# Patient Record
Sex: Female | Born: 1975
Health system: Southern US, Community
[De-identification: ages and names within clinical notes are randomized; demographics above are authoritative.]

## PROBLEM LIST (undated history)

## (undated) DIAGNOSIS — D649 Anemia, unspecified: Secondary | ICD-10-CM

## (undated) DIAGNOSIS — T7840XA Allergy, unspecified, initial encounter: Secondary | ICD-10-CM

## (undated) DIAGNOSIS — F32A Depression, unspecified: Secondary | ICD-10-CM

## (undated) DIAGNOSIS — F419 Anxiety disorder, unspecified: Secondary | ICD-10-CM

## (undated) HISTORY — DX: Allergy, unspecified, initial encounter: T78.40XA

## (undated) HISTORY — DX: Depression, unspecified: F32.A

## (undated) HISTORY — PX: APPENDECTOMY: SHX54

## (undated) HISTORY — DX: Anxiety disorder, unspecified: F41.9

## (undated) HISTORY — DX: Anemia, unspecified: D64.9

---

## 1999-11-16 ENCOUNTER — Other Ambulatory Visit: Admission: RE | Admit: 1999-11-16 | Discharge: 1999-11-16 | Payer: Self-pay | Admitting: Obstetrics and Gynecology

## 2000-05-17 ENCOUNTER — Other Ambulatory Visit: Admission: RE | Admit: 2000-05-17 | Discharge: 2000-05-17 | Payer: Self-pay | Admitting: Obstetrics and Gynecology

## 2000-08-29 ENCOUNTER — Other Ambulatory Visit: Admission: RE | Admit: 2000-08-29 | Discharge: 2000-08-29 | Payer: Self-pay | Admitting: Obstetrics and Gynecology

## 2001-08-28 ENCOUNTER — Other Ambulatory Visit: Admission: RE | Admit: 2001-08-28 | Discharge: 2001-08-28 | Payer: Self-pay | Admitting: Obstetrics and Gynecology

## 2002-11-13 ENCOUNTER — Other Ambulatory Visit: Admission: RE | Admit: 2002-11-13 | Discharge: 2002-11-13 | Payer: Self-pay | Admitting: Obstetrics and Gynecology

## 2003-11-18 ENCOUNTER — Encounter (INDEPENDENT_AMBULATORY_CARE_PROVIDER_SITE_OTHER): Payer: Self-pay

## 2003-11-18 ENCOUNTER — Inpatient Hospital Stay (HOSPITAL_COMMUNITY): Admission: AD | Admit: 2003-11-18 | Discharge: 2003-11-21 | Payer: Self-pay | Admitting: Obstetrics and Gynecology

## 2003-12-30 ENCOUNTER — Other Ambulatory Visit: Admission: RE | Admit: 2003-12-30 | Discharge: 2003-12-30 | Payer: Self-pay | Admitting: Obstetrics and Gynecology

## 2005-03-10 ENCOUNTER — Other Ambulatory Visit: Admission: RE | Admit: 2005-03-10 | Discharge: 2005-03-10 | Payer: Self-pay | Admitting: Obstetrics and Gynecology

## 2005-04-22 ENCOUNTER — Encounter (INDEPENDENT_AMBULATORY_CARE_PROVIDER_SITE_OTHER): Payer: Self-pay | Admitting: *Deleted

## 2005-04-22 ENCOUNTER — Observation Stay (HOSPITAL_COMMUNITY): Admission: EM | Admit: 2005-04-22 | Discharge: 2005-04-23 | Payer: Self-pay | Admitting: Emergency Medicine

## 2006-01-08 ENCOUNTER — Emergency Department (HOSPITAL_COMMUNITY): Admission: EM | Admit: 2006-01-08 | Discharge: 2006-01-08 | Payer: Self-pay | Admitting: Family Medicine

## 2006-03-20 IMAGING — CT CT ABDOMEN W/ CM
1 of 4 series · 14 of 32 positions shown, 19 images · IV contrast (omnipaque)
Comparison: None.

CLINICAL DATA: Abdominal pain, right lower quadrant.
TECHNIQUE: Contiguous axial CT images were taken through the abdomen and pelvis after the administration of 100 cc Omnipaque 300 contrast material.
CT ABDOMEN WITH CONTRAST:
There is a 2 mm non-calcified right lower lobe pulmonary nodule on image #3.  There is also incomplete visualization of a second 2-3 mm non-calcified pulmonary nodule anteriorly in the right lower lobe on image #1.  Lung bases are otherwise clear.  Heart appears normal.  No pleural or pericardial effusion.  Liver, gallbladder, spleen, pancreas and adrenal glands all appear normal.  No abdominal or pelvic lymphadenopathy.  No focal bony abnormality.

[Series 2: abd_pel 5.0 b40f st · axial · 0.66mm/px · z∈[+1356,+1750]mm · 14 of 89 slices shown, 19 images]
[im 5/89  soft-tissue]
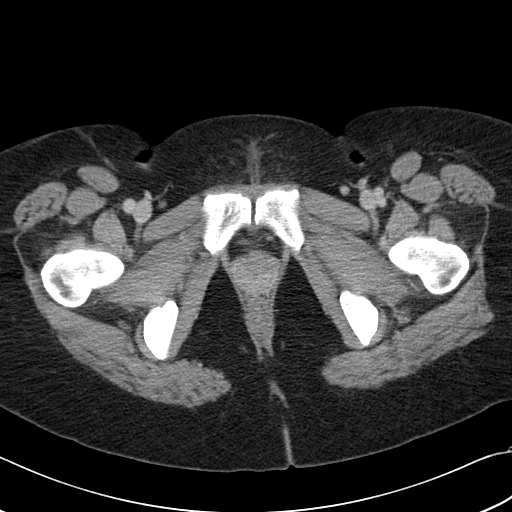
[im 5/89  bone]
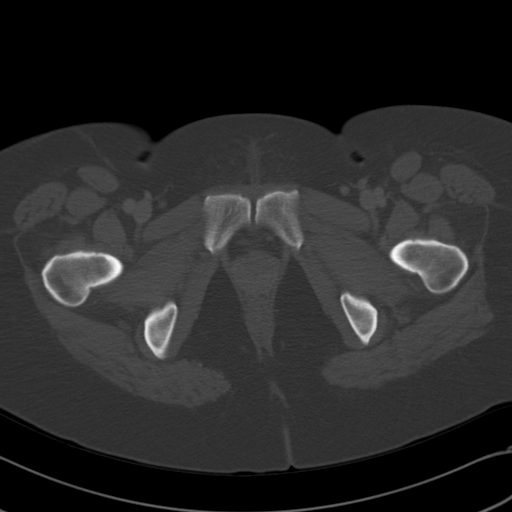
[im 14/89  soft-tissue]
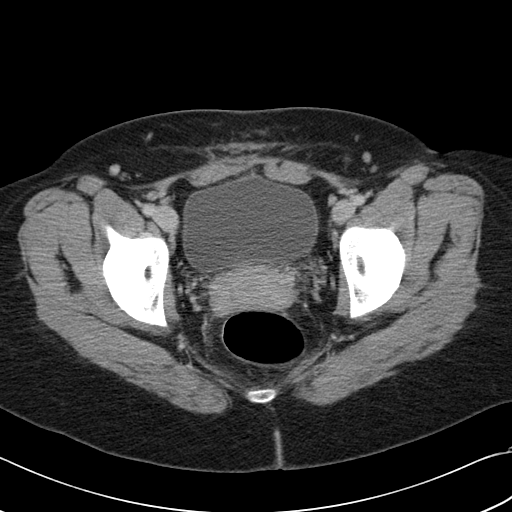
[im 19/89  soft-tissue]
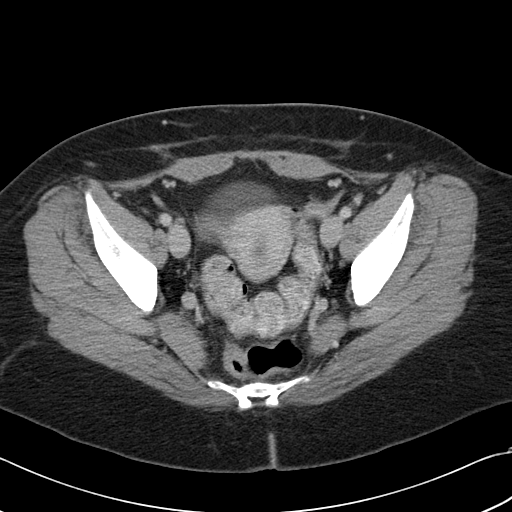
[im 24/89  soft-tissue]
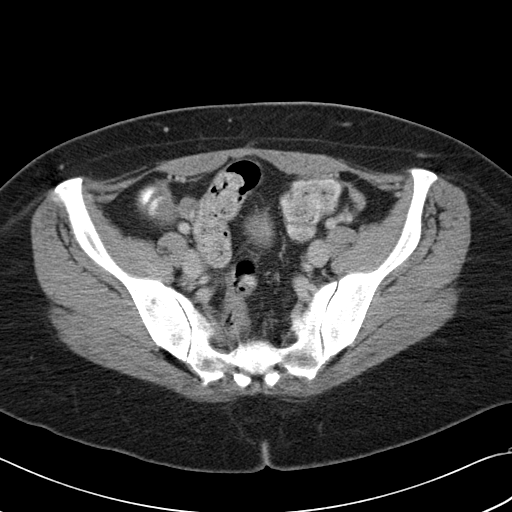
[im 33/89  soft-tissue]
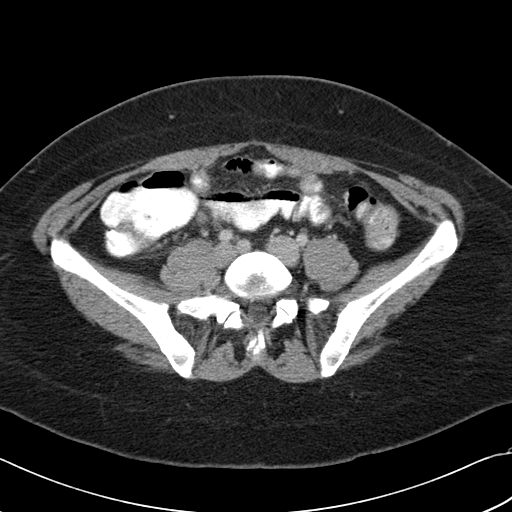
[im 38/89  soft-tissue]
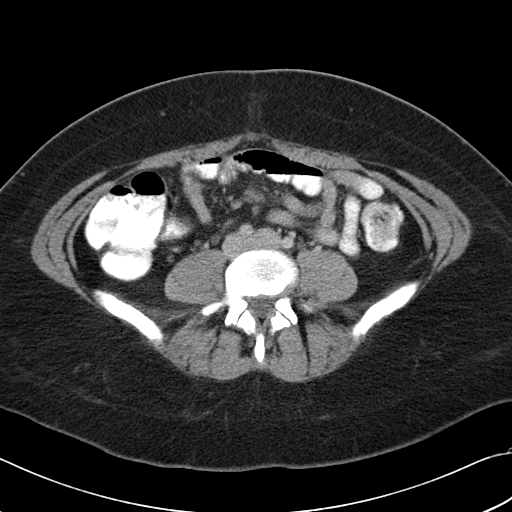
[im 47/89  soft-tissue]
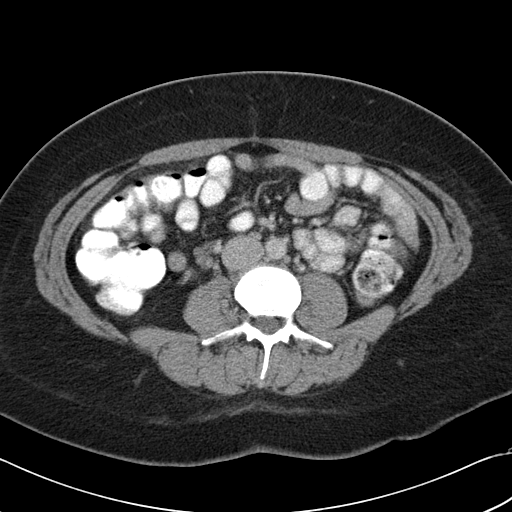
[im 51/89  soft-tissue]
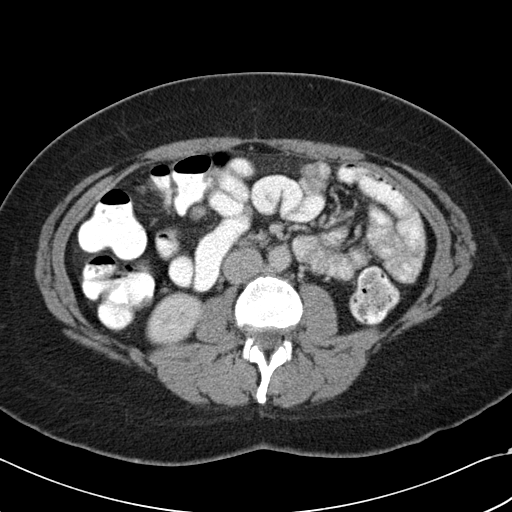
[im 56/89  soft-tissue]
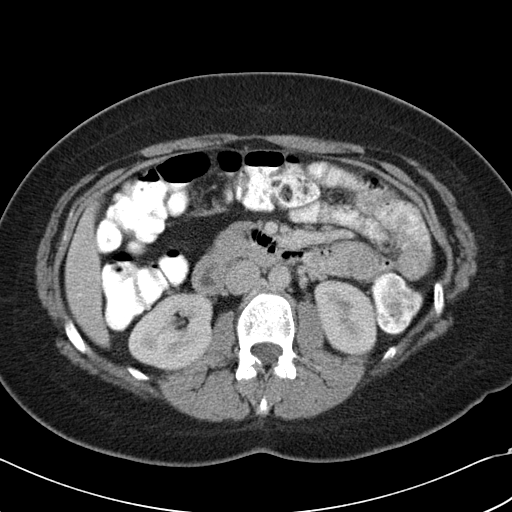
[im 56/89  bone]
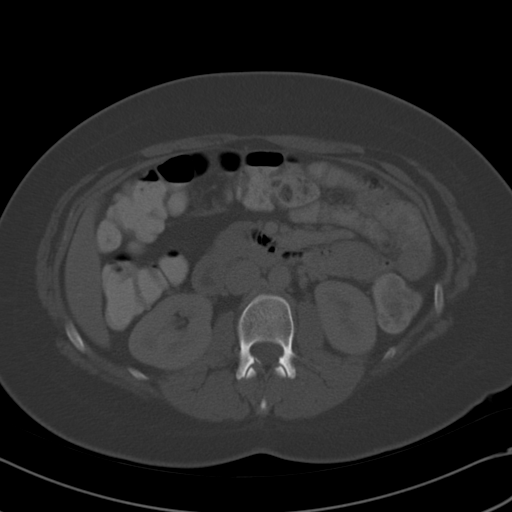
[im 65/89  soft-tissue]
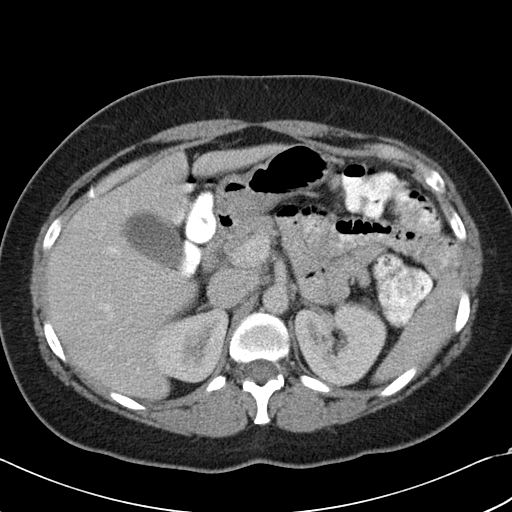
[im 70/89  soft-tissue]
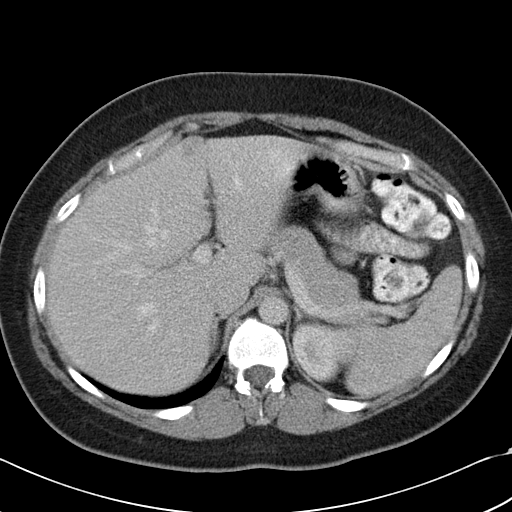
[im 70/89  lung]
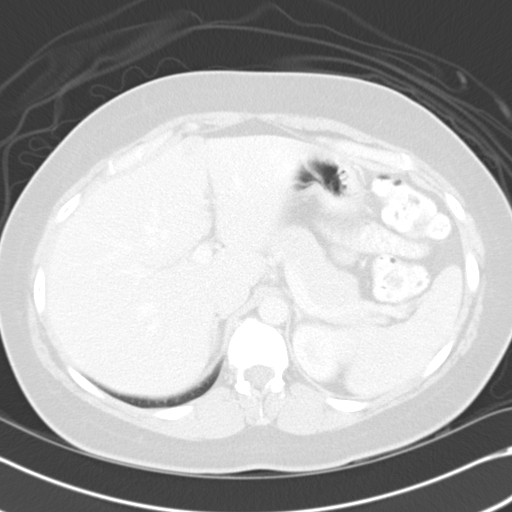
[im 75/89  soft-tissue]
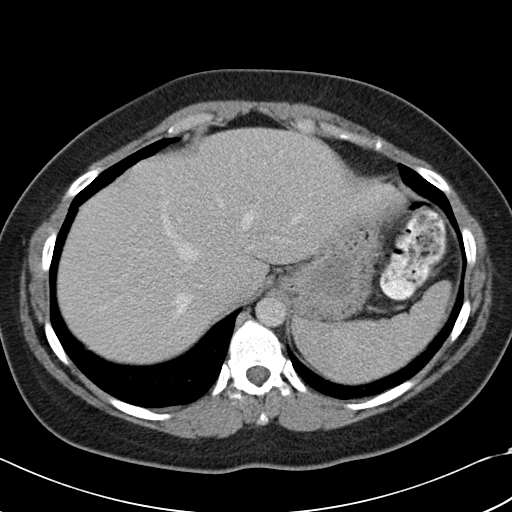
[im 75/89  lung]
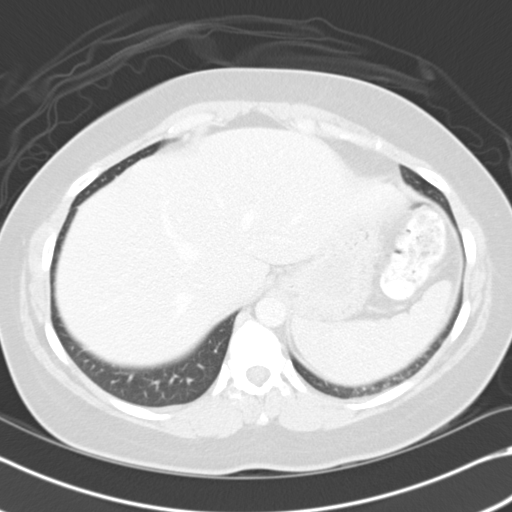
[im 79/89  lung]
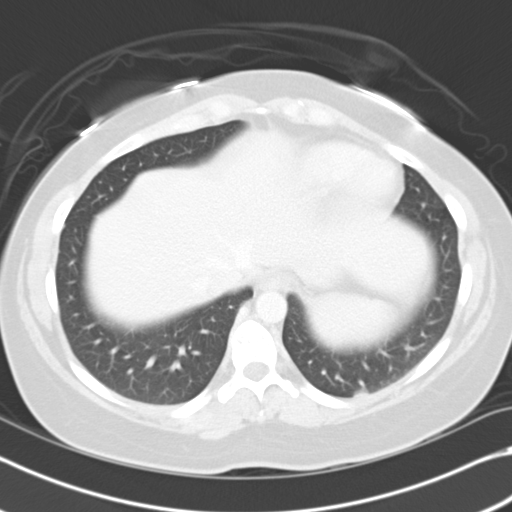
[im 84/89  soft-tissue]
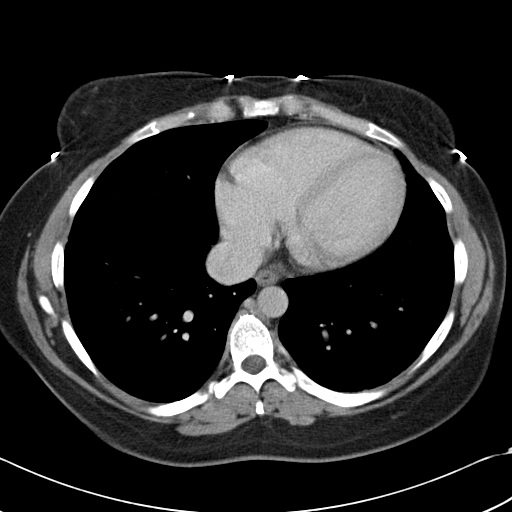
[im 84/89  lung]
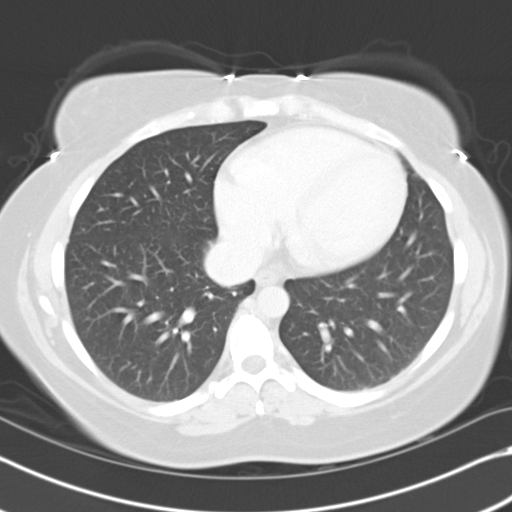

[14 of 32 positions shown; findings below may reference images not displayed]

IMPRESSION: No acute finding in the abdomen.  Tiny 2-3 mm non-calcified pulmonary nodules in the right lower lung are almost certainly benign.  
CT PELVIS WITH CONTRAST;
The appendix is visualized and is dilated at 1.1 cm.  There is some infiltration of the surrounding mesenteric fat.  Tiny amount of free pelvic fluid is identified.  No perforation.
IMPRESSION: Early appendicitis without perforation.

## 2006-04-06 ENCOUNTER — Other Ambulatory Visit: Admission: RE | Admit: 2006-04-06 | Discharge: 2006-04-06 | Payer: Self-pay | Admitting: Obstetrics and Gynecology

## 2006-12-28 ENCOUNTER — Inpatient Hospital Stay (HOSPITAL_COMMUNITY): Admission: AD | Admit: 2006-12-28 | Discharge: 2006-12-30 | Payer: Self-pay | Admitting: Obstetrics and Gynecology

## 2010-07-19 ENCOUNTER — Emergency Department (HOSPITAL_COMMUNITY): Admission: EM | Admit: 2010-07-19 | Discharge: 2010-07-19 | Payer: Self-pay | Admitting: Family Medicine

## 2010-10-12 LAB — HM PAP SMEAR: HM Pap smear: NORMAL

## 2011-05-07 NOTE — Op Note (Signed)
NAMEMANDY, PEEKS                ACCOUNT NO.:  [id number]   MEDICAL RECORD NO.:  [id number]          PATIENT TYPE:  INP   LOCATION:  0101                         FACILITY:  [facility]   PHYSICIAN:  Thornton Park. Daphine Deutscher, MD  DATE OF BIRTH:  05/03/1976   DATE OF PROCEDURE:  04/22/2005  DATE OF DISCHARGE:                                 OPERATIVE REPORT   PREOPERATIVE DIAGNOSIS:  Acute appendicitis.   POSTOPERATIVE DIAGNOSIS:  Acute appendicitis.   PROCEDURE:  Laparoscopic appendectomy.   SURGEON:  Thornton Park. Daphine Deutscher, M.D.   ANESTHESIA:  General endotracheal.   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  She was taken to room 1 and given general  anesthesia.  Because of her allergies, she was given Avelox IV preop.  The  abdomen was prepped with Betadine and draped sterilely.  A longitudinal  incision was made at her umbilicus with some difficulty in the preperitoneal  space.  I was able to, however, gain access to the abdomen and place a  Hasson cannula.  A 5 mm was placed in the right upper quadrant and a 10/11  in the left lower quadrant, through which the camera was placed.  The  appendix was mobilized.  It did have sort of a swirling mesentery wrapped  around it and tacked to the retroperitoneum, which I took down with the  Harmonic scalpel.  This isolated the base, which I transected with Endo GIA  vascular cartridge.  I carefully inspected the base and irrigated and  observed and cauterized any little oozing areas with the Harmonic scalpel.  No bleeding was noted.  I then looked at the pelvis and looked at her  uterus.  Everything looked to be in order, without significant adhesions.  Likewise, the gallbladder was visualized.  It was a nice robin's egg blue  color.  I went down and reexamined the appendiceal region, and this appeared  to be without  hemorrhage, and a good intact staple line was present.  We  then repaired the umbilical defect under laparoscopic vision and then  deflated the abdomen and removed the trocars.  The wounds were closed with 4-  0 Vicryl.  Also, I injected them with Marcaine preop.  The patient was taken  to the recovery room in satisfactory condition.      MBM/MEDQ  D:  04/22/2005  T:  04/22/2005  Job:  [id number]

## 2011-05-07 NOTE — Discharge Summary (Signed)
Colleen Floyd, Colleen Floyd                            ACCOUNT NO.:  [id number]   MEDICAL RECORD NO.:  [id number]                   PATIENT TYPE:  INP   LOCATION:  9146                                 FACILITY:  [facility]   PHYSICIAN:  Freddy Finner, M.D.                DATE OF BIRTH:  [birth year]   DATE OF ADMISSION:  11/18/2003  DATE OF DISCHARGE:  11/21/2003                                 DISCHARGE SUMMARY   ADMITTING DIAGNOSES:  1. Intrauterine pregnancy at 38-4/7 weeks estimated gestational age.  2. Repetitive late decelerations.  3. Oligohydramnios.   DISCHARGE DIAGNOSES:  1. Status post low transverse cesarean section secondary to fetal     intolerance of labor.  2. Viable female infant.   PROCEDURE:  Primary low transverse cesarean section.   REASON FOR ADMISSION:  Please see written H&P.   HOSPITAL COURSE:  The patient was a 35 year old white married female  primigravida that was admitted to [facility] after patient  presented to triage to rule out labor.  Upon admission variable  decelerations were noted.  Ultrasound revealed amniotic fluid index which  was 6.8 which was low.  The patient was then admitted for induction of  labor.  When patient had progressed to 4 cm, 75% effaced, patient did have  spontaneous rupture of membranes.  Shortly thereafter infant did sustain a  long episode of bradycardia to the 90s which lasted approximately five  minutes which did respond to position change, hydration, and oxygen.  However, infant did start having repetitive late decelerations despite the  above noted measures.  In view of the nonreassuring fetal heart tone  pattern, decision was made to proceed with a primary cesarean delivery.  The  patient had previously received an epidural and after transportation to the  operating room epidural was dosed to an adequate surgical level.  A low  transverse incision was made with the delivery of a viable female infant  weighing 6  pounds 0 ounces.  Apgars of 9 at one minute, 9 at five minutes.  Umbilical cord pH was 7.34.  The patient tolerated procedure well and was  taken to the recovery room in stable condition.  On postoperative day one  vital signs were stable.  The patient was afebrile.  Abdomen was soft with  good return of bowel function.  Abdominal dressing was noted to be clean,  dry, and intact.  Fundus was firm and nontender.  Laboratories revealed  hemoglobin of 8.5, platelet count of 183,000, WBC count of 13.1.  On  postoperative day two vital signs were stable.  The patient remained  afebrile.  Abdomen was soft.  Fundus was firm and nontender.  Abdominal  dressings had been removed revealing an incision that was clean, dry, and  intact.  The patient was ambulating well.  CBC had been repeated revealing a  hemoglobin of 7.5, platelet count 173,000, WBC  count 11.6.  On postoperative  day three patient was doing well.  Vital signs were stable.  Abdomen was  soft.  Fundus was firm.  Incision was clean, dry, and intact.  Staples  removed.  The patient was discharged home.   CONDITION ON DISCHARGE:  Good.   DIET:  Regular, as tolerated.   ACTIVITY:  No heavy lifting.  No driving x2 weeks.  No vaginal entry.   FOLLOWUP:  The patient is to follow up in the office in one to two weeks for  an incision check.  She is to call for temperature greater than 100 degrees,  persistent nausea, vomiting, heavy vaginal bleeding, and/or redness or  drainage from incisional site.   DISCHARGE MEDICATIONS:  1. Percocet 5/325 #30 one p.o. q.4-6h. p.r.n. pain.  2. Motrin 600 mg q.6h. p.r.n.  3. Niferex 150 mg one p.o. daily.  4. Colace one p.o. daily p.r.n.  5. Zoloft 25 mg one p.o. daily x7 days, then she is to increase to 50 mg one     p.o. daily.     Julio Sicks, N.P.                        Freddy Finner, M.D.    CC/MEDQ  D:  12/24/2003  T:  12/24/2003  Job:  [id number]

## 2011-05-07 NOTE — Discharge Summary (Signed)
NAMESHAN, PADGETT                ACCOUNT NO.:  [id number]   MEDICAL RECORD NO.:  [id number]          PATIENT TYPE:  INP   LOCATION:  9145                          FACILITY:  [facility]   PHYSICIAN:  Zelphia Cairo, MD    DATE OF BIRTH:  1976/09/21   DATE OF ADMISSION:  12/28/2006  DATE OF DISCHARGE:  12/30/2006                               DISCHARGE SUMMARY   ADMITTING DIAGNOSES:  1. Intrauterine pregnancy at term.  2. Previous cesarean section, desires repeat.   DISCHARGE DIAGNOSES:  1. Status post low transverse cesarean section.  2. Viable female infant.   PROCEDURE:  Repeat low transverse cesarean section.   REASON FOR ADMISSION:  Please see written H&P.   HOSPITAL COURSE:  The patient is 35 year old, gravida 2, para 1, that  presented to [facility] for scheduled cesarean section.  The patient had a previous cesarean section for failure to progress.  On  the morning of admission, the patient was taken to operating room where  spinal anesthesia was administered without difficulty.  Low transverse  incision was made with delivery of a viable female infant weighing 8  pounds 10 ounces with Apgars of 9 at 1 minute and 9 at 5 minutes.  The  patient tolerated the procedure well and was taken to the recovery room  in stable condition.   One day #1, the patient was without complaint.  Vital signs were stable.  She was afebrile.  Abdomen soft with good return of bowel function.  Fundus was firm and nontender.  Abdominal dressing was partially removed  revealing incision that is clean, dry and intact.  Staples were intact.  The patient was ambulating well, tolerating a regular diet without  complaints of nausea and vomiting.  Laboratory findings revealed  hemoglobin of 12.0, platelet count 159,000, WBC 10.5.   On postoperative day #2, the patient was doing well.  She was ambulating  without difficulty. She did desire early discharge.  Vital signs were  stable.  She was  afebrile.  Abdomen soft.  Fundus firm and nontender.  Incision was clean, dry and intact.  Staples remained intact.  The  patient was later discharged home.   CONDITION ON DISCHARGE:  Stable.   DIET:  Regular as tolerated.   ACTIVITY:  No heavy lifting, no driving x2 weeks, no vaginal entry.   FOLLOWUP:  Patient will follow up in the office in 2 days for incision  check and staple removal.  She is to call for temperature greater than  100 degrees, persistent nausea, vomiting, heavy vaginal bleeding and/or  redness or drainage from incisional site.   DISCHARGE MEDICATIONS:  1. Percocet 5/325, #30, 1 every 4-6 hours p.r.n.  2. Motrin 600 mg every 6 hours.  3. The patient is to complete Z-Pak azithromycin 250 mg one p.o.      daily.  4. Colace as needed taking      Julio Sicks, N.P.      Zelphia Cairo, MD  Electronically Signed    CC/MEDQ  D:  01/11/2007  T:  01/11/2007  Job:  [id number]

## 2011-05-07 NOTE — Op Note (Signed)
Colleen Floyd, Colleen Floyd                ACCOUNT NO.:  [id number]   MEDICAL RECORD NO.:  [id number]          PATIENT TYPE:  INP   LOCATION:  9199                          FACILITY:  [facility]   PHYSICIAN:  Michelle L. Grewal, M.D.DATE OF BIRTH:  1976/10/27   DATE OF PROCEDURE:  12/28/2006  DATE OF DISCHARGE:                               OPERATIVE REPORT   PREOPERATIVE DIAGNOSIS:  Intrauterine pregnancy at term, previous C-  section.   POSTOPERATIVE DIAGNOSIS:  Intrauterine pregnancy at term, previous C-  section.   PROCEDURE:  Repeat low transverse cesarean section.   SURGEON:  Michelle L. Vincente Poli, M.D.   ANESTHESIA:  Spinal.   SPECIMENS:  Female infant Apgars 9 at one minute and 9 at five minutes  with a loose nuchal cord times one.   ESTIMATED BLOOD LOSS:  500 mL.   COMPLICATIONS:  None.   PROCEDURE:  Patient taken to the operating room.  Her spinal was placed.  She was then prepped and draped in usual sterile fashion and a Foley  catheter is inserted.  A low transverse incision was made, uterus was  entered using a hemostat.  A low transverse incision was made.  The  fascia scored and extended laterally.  Peritoneum was entered using a  hemostat.  The bladder blade was inserted.  The lower uterine segment  was identified and the bladder flap was created sharply and then  digitally.  The bladder blade was then readjusted.  A low transverse  incision was made in the uterus.  Upon entry to the uterine cavity the  amniotic fluid was clear.  The baby was in cephalic presentation and was  delivered quite easily.  The baby was a female infant with a loose nuchal  cord x1 and Apgars 9 at one minute and 9 at five minutes.  Cord was  clamped and cut.  The baby was handed to the waiting pediatrician and  subsequently taken to newborn nursery. The uterus was exteriorized and  cleared of all clots and debris.  Pitocin and antibiotics were given.  The placenta was manually removed and noted to be  normal intact with  three-vessel cord.  The uterine incision was closed in one layer using 0  chromic in continuous running locked stitch.  The very center of the  incision was reinforced with two figure-of-eights of 0 chromic.  Hemostasis was excellent.  The uterus was returned to the abdomen.  Irrigation was performed.  The hemostasis was again noted to be good.  The peritoneum was closed using 0 Vicryl and the rectus muscles were  reapproximated as well.  The fascia was closed using 0  Vicryl running stitch starting each corner meeting in the midline.  After irrigation of subcutaneous layers, skin was closed with staples.  All sponge, lap and instrument counts were correct x2.  The patient went  to recovery room in stable condition.      Michelle L. Vincente Poli, M.D.  Electronically Signed     MLG/MEDQ  D:  12/28/2006  T:  12/28/2006  Job:  [id number]

## 2011-05-07 NOTE — Op Note (Signed)
Colleen Floyd, FREE                            ACCOUNT NO.:  [id number]   MEDICAL RECORD NO.:  [id number]                   PATIENT TYPE:  INP   LOCATION:  9146                                 FACILITY:  [facility]   PHYSICIAN:  Juluis Mire, M.D.                DATE OF BIRTH:  1976-11-08   DATE OF PROCEDURE:  11/18/2003  DATE OF DISCHARGE:                                 OPERATIVE REPORT   PREOPERATIVE DIAGNOSIS:  Intrauterine pregnancy at 53 1/2 weeks with  repetitive late decelerations, history of oligohydramnios.   POSTOPERATIVE DIAGNOSIS:  Intrauterine pregnancy at 38 1/2 weeks with  repetitive late decelerations, history of oligohydramnios.   OPERATIVE PROCEDURE:  Primary low transverse cesarean section.   SURGEON:  Juluis Mire, M.D.   ANESTHESIA:  Epidural.   ESTIMATED BLOOD LOSS:  400 to 500 mL.   PACKS AND DRAINS:  None.   BLOOD REPLACED:  None.   COMPLICATIONS:  None.   INDICATIONS FOR PROCEDURE:  The patient is a 35 year old primigravida  married white female who presented to triage for rule out labor check.  Variable decelerations were noted.  Amniotic fluid index was 6.8 which was  low.  The patient was brought in for induction.  She did receive an  epidural.  At that point, the cervix was 4 cm, 75% effaced, fluid was clear.  The infant had a prolonged episode of bradycardia to the 90s x 5 minutes  responding to position change, hydration, and oxygen.  However, the infant  started having repetitive late decelerations despite the above noted  measures.  In view of the nonreassuring fetal heart rate pattern, we  proceeded with primary cesarean section.  The risks were discussed including  the risks of infection, risks of hemorrhage, risks of injury to adjacent  organs that could require further exploratory surgery, the risks of deep  venous thrombosis and pulmonary embolus.  The patient expressed  understanding of indications and risks.   PROCEDURE IN DETAIL:   The patient was taken to the OR and placed in supine  position with a left lateral tilt.  After a satisfactory level of epidural  anesthesia had been obtained, the patient's abdomen was prepped out with  Betadine and draped in a sterile field.  A low transverse skin incision was  made with the knife and carried through the subcutaneous tissue.  The fascia  was entered sharply and the incision in the fascia extended laterally.  The  fascia was taken off the muscles superiorly and inferiorly.  The rectus  muscles were separated in the midline.  The peritoneum was entered sharply  and the incision in the peritoneum extended both superiorly and inferiorly.  A low transverse bladder flap was developed.  A low transverse uterine  incision was begun with the knife and extended laterally using manual  traction.  The infant presented in a vertex presentation delivering the  head  with fundal pressure, the infant is a viable female who weighed 6 pounds.  Apgars were 8 and 9.  The umbilical artery pH was 7.34.  There was no nuchal  cord or reason for the decelerations.  The placenta was sent to pathology  after it had been removed intact.  The uterus was then closed with a running  locking suture of 0 chromic with good hemostasis.  The urine output remained  clear and adequate.  Tubes and ovaries were unremarkable.  The muscles and  peritoneum were closed with a running suture of 3-0 Vicryl, the fascia was  closed with running suture of 0 PDS, the skin was closed with staples and  Steri-Strips.  Sponge, instrument, and needle counts was reported correct by  the circulating nurse x 3.  The patient tolerated the procedure well and was  returned to the recovery room in good condition.                                               Juluis Mire, M.D.    JSM/MEDQ  D:  11/18/2003  T:  11/18/2003  Job:  [id number]

## 2013-03-06 ENCOUNTER — Telehealth: Payer: Self-pay | Admitting: Family Medicine

## 2013-03-06 MED ORDER — FLUTICASONE PROPIONATE 50 MCG/ACT NA SUSP: 2.0000 | Freq: Every day | NASAL | Status: DC

## 2013-03-06 NOTE — Telephone Encounter (Signed)
Medication refilled per protocol. 

## 2013-05-17 ENCOUNTER — Other Ambulatory Visit: Payer: Self-pay | Admitting: Obstetrics and Gynecology

## 2013-12-09 ENCOUNTER — Emergency Department (INDEPENDENT_AMBULATORY_CARE_PROVIDER_SITE_OTHER): Payer: 59

## 2013-12-09 ENCOUNTER — Encounter (HOSPITAL_COMMUNITY): Payer: Self-pay | Admitting: Emergency Medicine

## 2013-12-09 ENCOUNTER — Emergency Department (HOSPITAL_COMMUNITY)
Admission: EM | Admit: 2013-12-09 | Discharge: 2013-12-09 | Disposition: A | Discharge status: Home / Self Care | Payer: 59 | Source: Home / Self Care | Attending: Family Medicine | Admitting: Family Medicine

## 2013-12-09 DIAGNOSIS — J111 Influenza due to unidentified influenza virus with other respiratory manifestations: Secondary | ICD-10-CM

## 2013-12-09 MED ORDER — HYDROCOD POLST-CHLORPHEN POLST 10-8 MG/5ML PO LQCR: 5.0000 mL | Freq: Two times a day (BID) | ORAL | Status: DC | PRN

## 2013-12-09 MED ORDER — ALBUTEROL SULFATE HFA 108 (90 BASE) MCG/ACT IN AERS: 2.0000 | INHALATION_SPRAY | RESPIRATORY_TRACT | Status: DC | PRN

## 2013-12-09 MED ORDER — OSELTAMIVIR PHOSPHATE 75 MG PO CAPS: 75.0000 mg | ORAL_CAPSULE | Freq: Two times a day (BID) | ORAL | Status: DC

## 2013-12-09 NOTE — ED Notes (Signed)
37 yr old is here today with complaints of cough-green; fever - highest 102; sinus press; HA; ear pain, bodyaches, chest congestion since Friday. She has taken Ibuprofen and Mucinex with no success. Denies: SOB, chest pain

## 2013-12-09 NOTE — ED Provider Notes (Signed)
CSN: [id number]     Arrival date & time 12/09/13  1429 History   First MD Initiated Contact with Patient 12/09/13 1515     Chief Complaint  Patient presents with  . Cough  . Fever   (Consider location/radiation/quality/duration/timing/severity/associated sxs/prior Treatment) HPI Comments: 37 year old female presents complaining of cough, fever to 102, sinus pressure, headache, body aches, ear pain, chest congestion. The symptoms began on Friday and they started very quickly. She has been taking numerous over-the-counter medications without relief since this began. Eyes temperature measured at home has been 102, measured this morning - she took ibuprofen and this came down. When this began, she had almost immediate onset of cough, fever to 101, and body aches. She has no recent travel or sick contacts. She is not having any chest pain or shortness of breath. She does have a history of sinus infections but that is not really what she feels like today.  Patient is a 37 y.o. female presenting with cough and fever.  Cough Associated symptoms: chills, ear pain, fever, headaches, rhinorrhea and sore throat   Associated symptoms: no chest pain, no myalgias, no rash and no shortness of breath   Fever Associated symptoms: chills, congestion, cough, ear pain, headaches, rhinorrhea and sore throat   Associated symptoms: no chest pain, no dysuria, no myalgias, no nausea, no rash and no vomiting     History reviewed. No pertinent past medical history. History reviewed. No pertinent past surgical history. No family history on file. History  Substance Use Topics  . Smoking status: Never Smoker   . Smokeless tobacco: Not on file  . Alcohol Use: No   OB History   Grav Para Term Preterm Abortions TAB SAB Ect Mult Living                 Review of Systems  Constitutional: Positive for fever and chills.  HENT: Positive for congestion, ear pain, rhinorrhea, sinus pressure and sore throat.   Eyes:  Negative for visual disturbance.  Respiratory: Positive for cough and chest tightness. Negative for shortness of breath.   Cardiovascular: Negative for chest pain, palpitations and leg swelling.  Gastrointestinal: Negative for nausea, vomiting and abdominal pain.  Endocrine: Negative for polydipsia and polyuria.  Genitourinary: Negative for dysuria, urgency and frequency.  Musculoskeletal: Negative for arthralgias and myalgias.  Skin: Negative for rash.  Neurological: Positive for headaches. Negative for dizziness, weakness and light-headedness.    Allergies  Penicillins and Sulfa antibiotics  Home Medications   Current Outpatient Rx  Name  Route  Sig  Dispense  Refill  . cetirizine (ZYRTEC) 10 MG tablet   Oral   Take 10 mg by mouth daily.         . fluticasone (FLONASE) 50 MCG/ACT nasal spray   Nasal   Place 2 sprays into the nose daily. Two sprays each nostril daily   30 g   1     Patient needs to be seen before any further refill ...   . sertraline (ZOLOFT) 50 MG tablet   Oral   Take 50 mg by mouth daily.         Marland Kitchen albuterol (PROVENTIL HFA;VENTOLIN HFA) 108 (90 BASE) MCG/ACT inhaler   Inhalation   Inhale 2 puffs into the lungs every 4 (four) hours as needed for wheezing or shortness of breath.   1 Inhaler   0   . chlorpheniramine-HYDROcodone (TUSSIONEX PENNKINETIC ER) 10-8 MG/5ML LQCR   Oral   Take 5 mLs by mouth  every 12 (twelve) hours as needed for cough.   115 mL   0   . ferrous fumarate (HEMOCYTE - 106 MG FE) 325 (106 FE) MG TABS   Oral   Take 1 tablet by mouth daily.         Marland Kitchen oseltamivir (TAMIFLU) 75 MG capsule   Oral   Take 1 capsule (75 mg total) by mouth every 12 (twelve) hours.   10 capsule   0    BP 128/74  Pulse 98  Temp(Src) 99.2 F (37.3 C) (Oral)  Resp 20  SpO2 97%  LMP 11/18/2013 Physical Exam  Nursing note and vitals reviewed. Constitutional: She is oriented to person, place, and time. Vital signs are normal. She appears  well-developed and well-nourished. No distress.  HENT:  Head: Normocephalic and atraumatic.  Right Ear: External ear normal.  Left Ear: External ear normal.  Nose: Right sinus exhibits maxillary sinus tenderness and frontal sinus tenderness. Left sinus exhibits maxillary sinus tenderness and frontal sinus tenderness.  Mouth/Throat: Oropharynx is clear and moist. No oropharyngeal exudate.  Neck: Normal range of motion. Neck supple. No JVD present.  Cardiovascular: Normal rate, regular rhythm and normal heart sounds.  Exam reveals no gallop and no friction rub.   No murmur heard. Pulmonary/Chest: Effort normal and breath sounds normal. No respiratory distress. She has no wheezes. She has no rales.  Lymphadenopathy:    She has no cervical adenopathy.  Neurological: She is alert and oriented to person, place, and time. She has normal strength. Coordination normal.  Skin: Skin is warm and dry. No rash noted. She is not diaphoretic.  Psychiatric: She has a normal mood and affect. Judgment normal.    ED Course  Procedures (including critical care time) Labs Review Labs Reviewed - No data to display Imaging Review Dg Sinuses Complete  12/09/2013   CLINICAL DATA:  Cough, congestion, sore throat, fever  EXAM: PARANASAL SINUSES - COMPLETE 3 + VIEW  COMPARISON:  None.  FINDINGS: Visualized paranasal sinuses are essentially clear.  No air-fluid levels are demonstrated.  IMPRESSION: Negative.   Electronically Signed   By: Charline Bills M.D.   On: 12/09/2013 16:00   Dg Chest 2 View  12/09/2013   CLINICAL DATA:  Cough.  EXAM: CHEST  2 VIEW  COMPARISON:  No prior.  FINDINGS: Mediastinum and hilar structures are normal. Lungs are clear. Mild cardiomegaly, pulmonary vascularity is normal. No pleural effusion or pneumothorax. No acute bony abnormality.  IMPRESSION: Mild cardiomegaly, no congestive heart failure. No acute cardiopulmonary disease.   Electronically Signed   By: Maisie Fus  Register   On:  12/09/2013 16:01      MDM   1. Influenza-like illness    XR negative.  Symptoms consistent with flu, treating with tamiflu and symptomatically.  F/U if not improving   New Prescriptions   ALBUTEROL (PROVENTIL HFA;VENTOLIN HFA) 108 (90 BASE) MCG/ACT INHALER    Inhale 2 puffs into the lungs every 4 (four) hours as needed for wheezing or shortness of breath.   CHLORPHENIRAMINE-HYDROCODONE (TUSSIONEX PENNKINETIC ER) 10-8 MG/5ML LQCR    Take 5 mLs by mouth every 12 (twelve) hours as needed for cough.   OSELTAMIVIR (TAMIFLU) 75 MG CAPSULE    Take 1 capsule (75 mg total) by mouth every 12 (twelve) hours.       Graylon Good, PA-C 12/09/13 [phone]

## 2013-12-10 NOTE — ED Provider Notes (Signed)
Medical screening examination/treatment/procedure(s) were performed by a resident physician or non-physician practitioner and as the supervising physician I was immediately available for consultation/collaboration.  Herman Fiero, MD    Kennis Buell S Consuella Scurlock, MD 12/10/13 0739 

## 2014-06-20 ENCOUNTER — Other Ambulatory Visit: Payer: Self-pay | Admitting: Obstetrics and Gynecology

## 2014-06-25 LAB — CYTOLOGY - PAP

## 2015-05-16 ENCOUNTER — Encounter: Payer: Self-pay | Admitting: Family Medicine

## 2015-06-13 ENCOUNTER — Emergency Department (HOSPITAL_COMMUNITY)
Admission: EM | Admit: 2015-06-13 | Discharge: 2015-06-13 | Disposition: A | Discharge status: Home / Self Care | Payer: 59 | Source: Home / Self Care | Attending: Family Medicine | Admitting: Family Medicine

## 2015-06-13 ENCOUNTER — Encounter (HOSPITAL_COMMUNITY): Payer: Self-pay | Admitting: Emergency Medicine

## 2015-06-13 DIAGNOSIS — J029 Acute pharyngitis, unspecified: Secondary | ICD-10-CM | POA: Diagnosis not present

## 2015-06-13 LAB — POCT RAPID STREP A: Streptococcus, Group A Screen (Direct): NEGATIVE

## 2015-06-13 MED ORDER — HYDROCOD POLST-CHLORPHEN POLST 10-8 MG/5ML PO LQCR: 5.0000 mL | Freq: Two times a day (BID) | ORAL | Status: DC | PRN

## 2015-06-13 MED ORDER — PREDNISONE 10 MG PO TABS: 30.0000 mg | ORAL_TABLET | Freq: Every day | ORAL | Status: DC

## 2015-06-13 MED ORDER — IPRATROPIUM BROMIDE 0.06 % NA SOLN: 2.0000 | Freq: Four times a day (QID) | NASAL | Status: DC

## 2015-06-13 NOTE — ED Notes (Signed)
C/o cold sx onset Tuesday morning  Sx include fevers, ST, cough, bilateral ear pain Taking OTC cold meds w/no relief Alert, no signs of acute distress

## 2015-06-13 NOTE — Discharge Instructions (Signed)
Thank you for coming in today. Call or go to the emergency room if you get worse, have trouble breathing, have chest pains, or palpitations.   Pharyngitis Pharyngitis is redness, pain, and swelling (inflammation) of your pharynx.  CAUSES  Pharyngitis is usually caused by infection. Most of the time, these infections are from viruses (viral) and are part of a cold. However, sometimes pharyngitis is caused by bacteria (bacterial). Pharyngitis can also be caused by allergies. Viral pharyngitis may be spread from person to person by coughing, sneezing, and personal items or utensils (cups, forks, spoons, toothbrushes). Bacterial pharyngitis may be spread from person to person by more intimate contact, such as kissing.  SIGNS AND SYMPTOMS  Symptoms of pharyngitis include:   Sore throat.   Tiredness (fatigue).   Low-grade fever.   Headache.  Joint pain and muscle aches.  Skin rashes.  Swollen lymph nodes.  Plaque-like film on throat or tonsils (often seen with bacterial pharyngitis). DIAGNOSIS  Your health care provider will ask you questions about your illness and your symptoms. Your medical history, along with a physical exam, is often all that is needed to diagnose pharyngitis. Sometimes, a rapid strep test is done. Other lab tests may also be done, depending on the suspected cause.  TREATMENT  Viral pharyngitis will usually get better in 3-4 days without the use of medicine. Bacterial pharyngitis is treated with medicines that kill germs (antibiotics).  HOME CARE INSTRUCTIONS   Drink enough water and fluids to keep your urine clear or pale yellow.   Only take over-the-counter or prescription medicines as directed by your health care provider:   If you are prescribed antibiotics, make sure you finish them even if you start to feel better.   Do not take aspirin.   Get lots of rest.   Gargle with 8 oz of salt water ( tsp of salt per 1 qt of water) as often as every 1-2  hours to soothe your throat.   Throat lozenges (if you are not at risk for choking) or sprays may be used to soothe your throat. SEEK MEDICAL CARE IF:   You have large, tender lumps in your neck.  You have a rash.  You cough up green, yellow-brown, or bloody spit. SEEK IMMEDIATE MEDICAL CARE IF:   Your neck becomes stiff.  You drool or are unable to swallow liquids.  You vomit or are unable to keep medicines or liquids down.  You have severe pain that does not go away with the use of recommended medicines.  You have trouble breathing (not caused by a stuffy nose). MAKE SURE YOU:   Understand these instructions.  Will watch your condition.  Will get help right away if you are not doing well or get worse. Document Released: 12/06/2005 Document Revised: 09/26/2013 Document Reviewed: 08/13/2013 [facility] Patient Information 2015 [place], [place]. This information is not intended to replace advice given to you by your health care provider. Make sure you discuss any questions you have with your health care provider.

## 2015-06-13 NOTE — ED Notes (Signed)
Call back number verified.

## 2015-06-13 NOTE — ED Provider Notes (Signed)
Colleen Floyd is a 39 y.o. female who presents to Urgent Care today for sore throat fever course voice runny nose cough congestion. Symptoms present for 3 days. Cough interferes with sleep. No vomiting diarrhea chest pain or palpitations. She's tried over-the-counter cough medicines which have helped some.   History reviewed. No pertinent past medical history. History reviewed. No pertinent past surgical history. History  Substance Use Topics  . Smoking status: Never Smoker   . Smokeless tobacco: Not on file  . Alcohol Use: No   ROS as above Medications: No current facility-administered medications for this encounter.   Current Outpatient Prescriptions  Medication Sig Dispense Refill  . cetirizine (ZYRTEC) 10 MG tablet Take 10 mg by mouth daily.    Marland Kitchen albuterol (PROVENTIL HFA;VENTOLIN HFA) 108 (90 BASE) MCG/ACT inhaler Inhale 2 puffs into the lungs every 4 (four) hours as needed for wheezing or shortness of breath. 1 Inhaler 0  . chlorpheniramine-HYDROcodone (TUSSIONEX) 10-8 MG/5ML LQCR Take 5 mLs by mouth every 12 (twelve) hours as needed for cough. 115 mL 0  . ferrous fumarate (HEMOCYTE - 106 MG FE) 325 (106 FE) MG TABS Take 1 tablet by mouth daily.    Marland Kitchen ipratropium (ATROVENT) 0.06 % nasal spray Place 2 sprays into both nostrils 4 (four) times daily. 15 mL 1  . predniSONE (DELTASONE) 10 MG tablet Take 3 tablets (30 mg total) by mouth daily. 15 tablet 0  . sertraline (ZOLOFT) 50 MG tablet Take 50 mg by mouth daily.    . [DISCONTINUED] fluticasone (FLONASE) 50 MCG/ACT nasal spray Place 2 sprays into the nose daily. Two sprays each nostril daily 30 g 1   Allergies  Allergen Reactions  . Penicillins   . Sulfa Antibiotics      Exam:  BP 123/79 mmHg  Pulse 84  Temp(Src) 98.7 F (37.1 C) (Oral)  Resp 14  SpO2 97%  LMP 05/21/2014 Gen: Well NAD HEENT: EOMI,  MMM posterior pharynx with cobblestoning. Clear nasal discharge. Cervical lymphadenopathy present bilaterally. Lungs: Normal  work of breathing. CTABL Heart: RRR no MRG Abd: NABS, Soft. Nondistended, Nontender Exts: Brisk capillary refill, warm and well perfused.   Results for orders placed or performed during the hospital encounter of 06/13/15 (from the past 24 hour(s))  POCT rapid strep A [facility])     Status: None   Collection Time: 06/13/15  3:54 PM  Result Value Ref Range   Streptococcus, Group A Screen (Direct) NEGATIVE NEGATIVE   No results found.  Assessment and Plan: 39 y.o. female with pharyngitis and bronchitis likely due to virus. Treat with Atrovent nasal spray prednisone and hydrocodone cough syrup.  Discussed warning signs or symptoms. Please see discharge instructions. Patient expresses understanding.     Gregor Hams, MD 06/13/15 [phone]

## 2015-06-16 LAB — CULTURE, GROUP A STREP: Strep A Culture: NEGATIVE

## 2015-06-16 NOTE — ED Notes (Signed)
Final report negative

## 2016-02-05 MED FILL — CAMRESE LO TABLET: 0.1-0.02 & | 90 days supply | Qty: 91 | Fill #1

## 2016-02-05 MED FILL — SERTRALINE HCL 50 MG TABLET: 50 | 90 days supply | Qty: 90 | Fill #1

## 2016-05-05 MED FILL — SERTRALINE HCL 50 MG TABLET: 50 | 90 days supply | Qty: 90 | Fill #2

## 2016-05-05 MED FILL — CAMRESE LO TABLET: 0.1-0.02 & | 90 days supply | Qty: 91 | Fill #2

## 2016-08-05 MED FILL — CAMRESE LO TABLET: 0.1-0.02 & | 90 days supply | Qty: 91 | Fill #3

## 2016-08-05 MED FILL — SERTRALINE HCL 50 MG TABLET: 50 | 90 days supply | Qty: 90 | Fill #3

## 2016-11-01 MED FILL — LEVONO-E ESTRAD 0.10-0.02-0: 0.1-0.02 & | 90 days supply | Qty: 91 | Fill #0

## 2016-11-01 MED FILL — SERTRALINE HCL 50 MG TABLET: 50 | 90 days supply | Qty: 90 | Fill #0

## 2016-11-04 ENCOUNTER — Ambulatory Visit (HOSPITAL_COMMUNITY)
Admission: EM | Admit: 2016-11-04 | Discharge: 2016-11-04 | Disposition: A | Discharge status: Home / Self Care | Payer: 59 | Attending: Emergency Medicine | Admitting: Emergency Medicine

## 2016-11-04 ENCOUNTER — Encounter (HOSPITAL_COMMUNITY): Payer: Self-pay | Admitting: Emergency Medicine

## 2016-11-04 DIAGNOSIS — H65113 Acute and subacute allergic otitis media (mucoid) (sanguinous) (serous), bilateral: Secondary | ICD-10-CM

## 2016-11-04 MED ORDER — AZITHROMYCIN 250 MG PO TABS: 250.0000 mg | ORAL_TABLET | Freq: Every day | ORAL | 0 refills | Status: DC

## 2016-11-04 MED FILL — AZITHROMYCIN 250 MG TABLET: 250 | 5 days supply | Qty: 6 | Fill #0

## 2016-11-04 NOTE — ED Provider Notes (Signed)
CSN: [id number]     Arrival date & time 11/04/16  1026 History   None    Chief Complaint  Patient presents with  . Otalgia   (Consider location/radiation/quality/duration/timing/severity/associated sxs/prior Treatment) The history is provided by the patient. No language interpreter was used.  Otalgia  Location:  Left Behind ear:  No abnormality Quality:  Aching and pressure Severity:  Moderate Onset quality:  Gradual Duration:  1 week Timing:  Constant Progression:  Worsening Chronicity:  New Relieved by:  Nothing Ineffective treatments:  None tried Associated symptoms: sore throat     History reviewed. No pertinent past medical history. History reviewed. No pertinent surgical history. History reviewed. No pertinent family history. Social History  Substance Use Topics  . Smoking status: Never Smoker  . Smokeless tobacco: Never Used  . Alcohol use No   OB History    No data available     Review of Systems  HENT: Positive for ear pain and sore throat.   All other systems reviewed and are negative.   Allergies  Penicillins and Sulfa antibiotics  Home Medications   Prior to Admission medications   Medication Sig Start Date End Date Taking? Authorizing Provider  cetirizine (ZYRTEC) 10 MG tablet Take 10 mg by mouth daily.   Yes Historical Provider, MD  Levonorgestrel-Ethinyl Estradiol (CAMRESE) 0.15-0.03 &0.01 MG tablet Take 1 tablet by mouth daily.   Yes Historical Provider, MD  sertraline (ZOLOFT) 50 MG tablet Take 50 mg by mouth daily.   Yes Historical Provider, MD  albuterol (PROVENTIL HFA;VENTOLIN HFA) 108 (90 BASE) MCG/ACT inhaler Inhale 2 puffs into the lungs every 4 (four) hours as needed for wheezing or shortness of breath. 12/09/13   Freeman Caldron Baker, PA-C  azithromycin (ZITHROMAX) 250 MG tablet Take 1 tablet (250 mg total) by mouth daily. Take first 2 tablets together, then 1 every day until finished. 11/04/16   Fransico Meadow, PA-C  azithromycin (ZITHROMAX)  250 MG tablet Take 1 tablet (250 mg total) by mouth daily. Take first 2 tablets together, then 1 every day until finished. 11/04/16   Fransico Meadow, PA-C  chlorpheniramine-HYDROcodone (TUSSIONEX) 10-8 MG/5ML LQCR Take 5 mLs by mouth every 12 (twelve) hours as needed for cough. 06/13/15   Gregor Hams, MD  ferrous fumarate (HEMOCYTE - 106 MG FE) 325 (106 FE) MG TABS Take 1 tablet by mouth daily.    Historical Provider, MD  ipratropium (ATROVENT) 0.06 % nasal spray Place 2 sprays into both nostrils 4 (four) times daily. 06/13/15   Gregor Hams, MD  predniSONE (DELTASONE) 10 MG tablet Take 3 tablets (30 mg total) by mouth daily. 06/13/15   Gregor Hams, MD   Meds Ordered and Administered this Visit  Medications - No data to display  BP 113/74 (BP Location: Left Arm)   Pulse 78   Temp 98.5 F (36.9 C) (Oral)   Resp 18   SpO2 100%  No data found.   Physical Exam  Constitutional: She is oriented to person, place, and time. She appears well-developed and well-nourished.  HENT:  Head: Normocephalic.  Nose: Nose normal.  Mouth/Throat: Oropharynx is clear and moist.  Dull tm's bilat swelling  Eyes: EOM are normal.  Neck: Normal range of motion.  Pulmonary/Chest: Effort normal.  Abdominal: She exhibits no distension.  Musculoskeletal: Normal range of motion.  Neurological: She is alert and oriented to person, place, and time.  Psychiatric: She has a normal mood and affect.  Nursing note and vitals reviewed.  Urgent Care Course   Clinical Course     Procedures (including critical care time)  Labs Review Labs Reviewed - No data to display  Imaging Review No results found.   Visual Acuity Review  Right Eye Distance:   Left Eye Distance:   Bilateral Distance:    Right Eye Near:   Left Eye Near:    Bilateral Near:         MDM   1. Acute mucoid otitis media of both ears    Meds ordered this encounter  Medications  . Levonorgestrel-Ethinyl Estradiol (CAMRESE)  0.15-0.03 &0.01 MG tablet    Sig: Take 1 tablet by mouth daily.  Marland Kitchen azithromycin (ZITHROMAX) 250 MG tablet    Sig: Take 1 tablet (250 mg total) by mouth daily. Take first 2 tablets together, then 1 every day until finished.    Dispense:  6 tablet    Refill:  0    Order Specific Question:   Supervising Provider    Answer:   Melony Overly [id number]  . azithromycin (ZITHROMAX) 250 MG tablet    Sig: Take 1 tablet (250 mg total) by mouth daily. Take first 2 tablets together, then 1 every day until finished.    Dispense:  6 tablet    Refill:  0    Order Specific Question:   Supervising Provider    Answer:   Melony Overly [phone]  An After Visit Summary was printed and given to the patient.    Harvey, PA-C 11/04/16 [phone]

## 2016-11-04 NOTE — ED Triage Notes (Signed)
Here for intermittent left ear pain onset 1 week... Voices no other concerns  Denies fevers, chills  A&O x4... NAD

## 2016-11-23 DIAGNOSIS — Z01419 Encounter for gynecological examination (general) (routine) without abnormal findings: Secondary | ICD-10-CM | POA: Diagnosis not present

## 2016-11-23 DIAGNOSIS — Z1212 Encounter for screening for malignant neoplasm of rectum: Secondary | ICD-10-CM | POA: Diagnosis not present

## 2016-11-23 DIAGNOSIS — Z6833 Body mass index (BMI) 33.0-33.9, adult: Secondary | ICD-10-CM | POA: Diagnosis not present

## 2016-12-14 DIAGNOSIS — Z30430 Encounter for insertion of intrauterine contraceptive device: Secondary | ICD-10-CM | POA: Diagnosis not present

## 2016-12-14 DIAGNOSIS — Z3202 Encounter for pregnancy test, result negative: Secondary | ICD-10-CM | POA: Diagnosis not present

## 2017-01-26 DIAGNOSIS — Z30431 Encounter for routine checking of intrauterine contraceptive device: Secondary | ICD-10-CM | POA: Diagnosis not present

## 2017-02-26 ENCOUNTER — Encounter (HOSPITAL_COMMUNITY): Payer: Self-pay | Admitting: *Deleted

## 2017-02-26 ENCOUNTER — Ambulatory Visit (HOSPITAL_COMMUNITY)
Admission: EM | Admit: 2017-02-26 | Discharge: 2017-02-26 | Disposition: A | Discharge status: Home / Self Care | Payer: 59 | Attending: Internal Medicine | Admitting: Internal Medicine

## 2017-02-26 DIAGNOSIS — J209 Acute bronchitis, unspecified: Secondary | ICD-10-CM

## 2017-02-26 DIAGNOSIS — R059 Cough, unspecified: Secondary | ICD-10-CM

## 2017-02-26 DIAGNOSIS — R05 Cough: Secondary | ICD-10-CM | POA: Diagnosis not present

## 2017-02-26 MED ORDER — AZITHROMYCIN 250 MG PO TABS: 250.0000 mg | ORAL_TABLET | Freq: Every day | ORAL | 0 refills | Status: DC

## 2017-02-26 MED ORDER — METHYLPREDNISOLONE 4 MG PO TBPK: ORAL_TABLET | ORAL | 0 refills | Status: DC

## 2017-02-26 MED ORDER — BENZONATATE 100 MG PO CAPS: 200.0000 mg | ORAL_CAPSULE | Freq: Three times a day (TID) | ORAL | 0 refills | Status: DC | PRN

## 2017-02-26 NOTE — ED Triage Notes (Signed)
C/O productive cough x 3 wks without fever.  Has been taking Delsym.

## 2017-02-26 NOTE — ED Provider Notes (Signed)
CSN: [id number]     Arrival date & time 02/26/17  1233 History   None    Chief Complaint  Patient presents with  . Cough   (Consider location/radiation/quality/duration/timing/severity/associated sxs/prior Treatment) Patient c/o wheezing and cough and uri sx's for 3 weeks.   The history is provided by the patient.  Cough  Cough characteristics:  Productive Sputum characteristics:  White Severity:  Moderate Onset quality:  Sudden Duration:  3 weeks Timing:  Constant Progression:  Worsening Chronicity:  New Smoker: no   Context: upper respiratory infection and weather changes   Relieved by:  Nothing Worsened by:  Nothing Ineffective treatments:  None tried Associated symptoms: wheezing     History reviewed. No pertinent past medical history. Past Surgical History:  Procedure Laterality Date  . APPENDECTOMY    . CESAREAN SECTION     x2   No family history on file. Social History  Substance Use Topics  . Smoking status: Never Smoker  . Smokeless tobacco: Never Used  . Alcohol use No   OB History    No data available     Review of Systems  Constitutional: Negative.   HENT: Negative.   Eyes: Negative.   Respiratory: Positive for cough and wheezing.   Gastrointestinal: Negative.   Endocrine: Negative.   Genitourinary: Negative.   Musculoskeletal: Negative.   Skin: Negative.   Allergic/Immunologic: Negative.   Neurological: Negative.   Hematological: Negative.   Psychiatric/Behavioral: Negative.     Allergies  Penicillins and Sulfa antibiotics  Home Medications   Prior to Admission medications   Medication Sig Start Date End Date Taking? Authorizing Provider  cetirizine (ZYRTEC) 10 MG tablet Take 10 mg by mouth daily.   Yes Historical Provider, MD  Loratadine-Pseudoephedrine (CLARITIN-D 12 HOUR PO) Take by mouth.   Yes Historical Provider, MD  sertraline (ZOLOFT) 50 MG tablet Take 50 mg by mouth daily.   Yes Historical Provider, MD  albuterol  (PROVENTIL HFA;VENTOLIN HFA) 108 (90 BASE) MCG/ACT inhaler Inhale 2 puffs into the lungs every 4 (four) hours as needed for wheezing or shortness of breath. 12/09/13   Freeman Caldron Baker, PA-C  azithromycin (ZITHROMAX) 250 MG tablet Take 1 tablet (250 mg total) by mouth daily. Take first 2 tablets together, then 1 every day until finished. 11/04/16   Fransico Meadow, PA-C  azithromycin (ZITHROMAX) 250 MG tablet Take 1 tablet (250 mg total) by mouth daily. Take first 2 tablets together, then 1 every day until finished. 11/04/16   Fransico Meadow, PA-C  azithromycin (ZITHROMAX) 250 MG tablet Take 1 tablet (250 mg total) by mouth daily. Take first 2 tablets together, then 1 every day until finished. 02/26/17   Lysbeth Penner, FNP  benzonatate (TESSALON) 100 MG capsule Take 2 capsules (200 mg total) by mouth 3 (three) times daily as needed for cough. 02/26/17   Lysbeth Penner, FNP  chlorpheniramine-HYDROcodone (TUSSIONEX) 10-8 MG/5ML LQCR Take 5 mLs by mouth every 12 (twelve) hours as needed for cough. 06/13/15   Gregor Hams, MD  ferrous fumarate (HEMOCYTE - 106 MG FE) 325 (106 FE) MG TABS Take 1 tablet by mouth daily.    Historical Provider, MD  ipratropium (ATROVENT) 0.06 % nasal spray Place 2 sprays into both nostrils 4 (four) times daily. 06/13/15   Gregor Hams, MD  Levonorgestrel-Ethinyl Estradiol (CAMRESE) 0.15-0.03 &0.01 MG tablet Take 1 tablet by mouth daily.    Historical Provider, MD  methylPREDNISolone (MEDROL DOSEPAK) 4 MG TBPK tablet Take 6-5-4-3-2-1 po  qd 02/26/17   Lysbeth Penner, FNP  predniSONE (DELTASONE) 10 MG tablet Take 3 tablets (30 mg total) by mouth daily. 06/13/15   Gregor Hams, MD   Meds Ordered and Administered this Visit  Medications - No data to display  BP 103/57   Pulse 75   Temp 98.9 F (37.2 C) (Oral)   Resp 16   SpO2 98%  No data found.   Physical Exam  Constitutional: She appears well-developed and well-nourished.  HENT:  Head: Normocephalic and atraumatic.   Right Ear: External ear normal.  Left Ear: External ear normal.  Mouth/Throat: Oropharynx is clear and moist.  Eyes: Conjunctivae and EOM are normal. Pupils are equal, round, and reactive to light.  Neck: Normal range of motion. Neck supple.  Cardiovascular: Normal rate and regular rhythm.   Pulmonary/Chest: Effort normal. She has wheezes.  Abdominal: Soft. Bowel sounds are normal.  Nursing note and vitals reviewed.   Urgent Care Course     Procedures (including critical care time)  Labs Review Labs Reviewed - No data to display  Imaging Review No results found.   Visual Acuity Review  Right Eye Distance:   Left Eye Distance:   Bilateral Distance:    Right Eye Near:   Left Eye Near:    Bilateral Near:         MDM   1. Cough   2. Acute bronchitis, unspecified organism    zPAK Medrol dose pack as directed Tessalon Perles 200mg  po tid prn  Push po fluids, rest, tylenol and motrin otc prn as directed for fever, arthralgias, and myalgias.  Follow up prn if sx's continue or persist.    Lysbeth Penner, FNP 02/26/17 1353

## 2017-05-30 MED FILL — SERTRALINE HCL 50 MG TABLET: 50 | 90 days supply | Qty: 90 | Fill #0

## 2017-06-30 DIAGNOSIS — Z30431 Encounter for routine checking of intrauterine contraceptive device: Secondary | ICD-10-CM | POA: Diagnosis not present

## 2017-09-01 DIAGNOSIS — H5212 Myopia, left eye: Secondary | ICD-10-CM | POA: Diagnosis not present

## 2017-09-01 DIAGNOSIS — H52221 Regular astigmatism, right eye: Secondary | ICD-10-CM | POA: Diagnosis not present

## 2017-09-15 MED FILL — SERTRALINE HCL 50 MG TABLET: 50 | 90 days supply | Qty: 90 | Fill #1

## 2018-01-02 MED FILL — SERTRALINE HCL 50 MG TABLET: 50 | 90 days supply | Qty: 90 | Fill #2

## 2018-01-24 DIAGNOSIS — Z1231 Encounter for screening mammogram for malignant neoplasm of breast: Secondary | ICD-10-CM | POA: Diagnosis not present

## 2018-01-24 DIAGNOSIS — Z800 Family history of malignant neoplasm of digestive organs: Secondary | ICD-10-CM | POA: Diagnosis not present

## 2018-01-24 DIAGNOSIS — Z801 Family history of malignant neoplasm of trachea, bronchus and lung: Secondary | ICD-10-CM | POA: Diagnosis not present

## 2018-01-24 DIAGNOSIS — Z01419 Encounter for gynecological examination (general) (routine) without abnormal findings: Secondary | ICD-10-CM | POA: Diagnosis not present

## 2018-01-24 DIAGNOSIS — Z6839 Body mass index (BMI) 39.0-39.9, adult: Secondary | ICD-10-CM | POA: Diagnosis not present

## 2018-03-31 DIAGNOSIS — Z809 Family history of malignant neoplasm, unspecified: Secondary | ICD-10-CM | POA: Diagnosis not present

## 2018-03-31 MED FILL — FLUCONAZOLE 150 MG TABS: 150 | 1 days supply | Qty: 1 | Fill #0

## 2018-03-31 MED FILL — SERTRALINE HCL 50 MG TABLET: 50 | 90 days supply | Qty: 90 | Fill #0

## 2018-05-04 ENCOUNTER — Encounter: Payer: Self-pay | Admitting: *Deleted

## 2018-05-05 MED FILL — FLUCONAZOLE 150 MG TABS: 150 | 1 days supply | Qty: 1 | Fill #1

## 2018-05-09 DIAGNOSIS — Z30431 Encounter for routine checking of intrauterine contraceptive device: Secondary | ICD-10-CM | POA: Diagnosis not present

## 2018-05-09 DIAGNOSIS — R102 Pelvic and perineal pain: Secondary | ICD-10-CM | POA: Diagnosis not present

## 2018-05-22 MED FILL — FLUCONAZOLE 150 MG TABS: 150 | 1 days supply | Qty: 1 | Fill #2

## 2018-06-02 ENCOUNTER — Encounter: Payer: Self-pay | Admitting: Family Medicine

## 2018-06-02 ENCOUNTER — Ambulatory Visit (INDEPENDENT_AMBULATORY_CARE_PROVIDER_SITE_OTHER): Payer: 59 | Admitting: Family Medicine

## 2018-06-02 VITALS — BP 104/72 | HR 84 | Temp 98.0°F | Resp 16 | Ht 64.0 in | Wt 236.0 lb

## 2018-06-02 DIAGNOSIS — Z7689 Persons encountering health services in other specified circumstances: Secondary | ICD-10-CM

## 2018-06-02 DIAGNOSIS — Z0000 Encounter for general adult medical examination without abnormal findings: Secondary | ICD-10-CM | POA: Diagnosis not present

## 2018-06-02 NOTE — Progress Notes (Addendum)
Subjective:    Patient ID: Colleen Floyd, female    DOB: [birth year], 42 y.o.   MRN: [id number]  HPI Patient is here today for complete physical exam and to establish care.  She has 1 can medical concern.  She reports intermittent right upper quadrant abdominal pain.  She describes it as a dull ache.  It comes and goes.  There is no exacerbating or alleviating factor.  She will go days without pain.  Then she will have the pain off and on for a day.  Sometimes the pain will be severe like someone stabbing her.  It is located below her right breast.  It sounds like biliary colic.  She has not had an ultrasound yet.  She is debating whether she should get the ultrasound.  She sees a gynecologist who performs her mammogram and Pap smear.  These have been done this year and are up-to-date.  She is due for fasting lab work.. Past Medical History:  Diagnosis Date  . Allergy    seasonal  . Anemia    Past Surgical History:  Procedure Laterality Date  . APPENDECTOMY    . CESAREAN SECTION     x2   Current Outpatient Medications on File Prior to Visit  Medication Sig Dispense Refill  . cetirizine (ZYRTEC) 10 MG tablet Take 10 mg by mouth daily as needed for allergies.    . fluticasone (FLONASE) 50 MCG/ACT nasal spray Place 2 sprays into the nose daily. Two sprays each nostril daily    . loratadine-pseudoephedrine (CLARITIN-D 24-HOUR) 10-240 MG 24 hr tablet Take 1 tablet by mouth daily as needed for allergies.    Marland Kitchen pyridOXINE (VITAMIN B-6) 100 MG tablet Take 100 mg by mouth daily.     No current facility-administered medications on file prior to visit.    Allergies  Allergen Reactions  . Penicillins   . Sulfa Antibiotics    Social History   Socioeconomic History  . Marital status: Married    Spouse name: Not on file  . Number of children: Not on file  . Years of education: Not on file  . Highest education level: Not on file  Occupational History  . Not on file  Social Needs  .  Financial resource strain: Not on file  . Food insecurity:    Worry: Not on file    Inability: Not on file  . Transportation needs:    Medical: Not on file    Non-medical: Not on file  Tobacco Use  . Smoking status: Never Smoker  . Smokeless tobacco: Never Used  Substance and Sexual Activity  . Alcohol use: No  . Drug use: No  . Sexual activity: Yes    Birth control/protection: IUD  Lifestyle  . Physical activity:    Days per week: Not on file    Minutes per session: Not on file  . Stress: Not on file  Relationships  . Social connections:    Talks on phone: Not on file    Gets together: Not on file    Attends religious service: Not on file    Active member of club or organization: Not on file    Attends meetings of clubs or organizations: Not on file    Relationship status: Not on file  . Intimate partner violence:    Fear of current or ex partner: Not on file    Emotionally abused: Not on file    Physically abused: Not on file    Forced  sexual activity: Not on file  Other Topics Concern  . Not on file  Social History Narrative  . Not on file   Family History  Problem Relation Age of Onset  . Cancer Mother        lung  . Asthma Mother   . Neuropathy Mother   . Atrial fibrillation Father   . Atrial fibrillation Brother   . Cancer Paternal Uncle        colon  . Cancer Maternal Grandmother        colon  . Cancer Cousin        colon      Review of Systems  All other systems reviewed and are negative.      Objective:   Physical Exam  Constitutional: She is oriented to person, place, and time. She appears well-developed and well-nourished. No distress.  HENT:  Head: Normocephalic and atraumatic.  Right Ear: External ear normal.  Left Ear: External ear normal.  Nose: Nose normal.  Mouth/Throat: Oropharynx is clear and moist. No oropharyngeal exudate.  Eyes: Pupils are equal, round, and reactive to light. Conjunctivae and EOM are normal. Right eye  exhibits no discharge. Left eye exhibits no discharge. No scleral icterus.  Neck: Normal range of motion. Neck supple. No JVD present. No tracheal deviation present. No thyromegaly present.  Cardiovascular: Normal rate, regular rhythm, normal heart sounds and intact distal pulses. Exam reveals no gallop and no friction rub.  No murmur heard. Pulmonary/Chest: Effort normal and breath sounds normal. No stridor. No respiratory distress. She has no wheezes. She has no rales. She exhibits no tenderness.  Abdominal: Soft. Bowel sounds are normal. She exhibits no distension and no mass. There is no tenderness. There is no rebound and no guarding. No hernia.  Musculoskeletal: She exhibits no edema, tenderness or deformity.  Lymphadenopathy:    She has no cervical adenopathy.  Neurological: She is alert and oriented to person, place, and time. She displays normal reflexes. No cranial nerve deficit or sensory deficit. She exhibits normal muscle tone. Coordination normal.  Skin: Skin is warm. Lesion and rash noted. Rash is macular. She is not diaphoretic.     Vitals reviewed.         Assessment & Plan:  Encounter to establish care with new doctor - Plan: CBC with Differential/Platelet, COMPLETE METABOLIC PANEL WITH GFR, Lipid panel  General medical exam - Plan: CBC with Differential/Platelet, COMPLETE METABOLIC PANEL WITH GFR, Lipid panel  Physical exam today is completely normal except for an elevated BMI.  I recommended that the patient return fasting for a CBC, CMP, fasting lipid panel.  Immunizations are up-to-date and cancer screening is up-to-date.  There is a suspicious lesion on her left forearm.  It is 1 cm in diameter.  It is a pink macule with silvery scale.  Differential diagnosis includes guttate psoriasis versus actinic keratosis.  Together we have elected to monitor the area.  If it begins to grow or spread, she will return for a biopsy.  Otherwise we will continue to monitor it as it  causes her no symptoms.  I believe her abdominal pain is likely biliary colic.  I recommended a right upper quadrant ultrasound but she declines at the present time.  She will call me when she is ready for me to schedule the ultrasound for her to evaluate further.

## 2018-07-14 MED FILL — SERTRALINE HCL 50 MG TABLET: 50 | 90 days supply | Qty: 90 | Fill #1

## 2018-10-17 DIAGNOSIS — H524 Presbyopia: Secondary | ICD-10-CM | POA: Diagnosis not present

## 2018-11-22 MED FILL — SERTRALINE HCL 50 MG TABLET: 50 | 90 days supply | Qty: 90 | Fill #2

## 2019-01-19 MED FILL — FLUCONAZOLE 150 MG TABS: 150 | 1 days supply | Qty: 1 | Fill #3

## 2019-02-07 DIAGNOSIS — Z1231 Encounter for screening mammogram for malignant neoplasm of breast: Secondary | ICD-10-CM | POA: Diagnosis not present

## 2019-02-07 DIAGNOSIS — Z1212 Encounter for screening for malignant neoplasm of rectum: Secondary | ICD-10-CM | POA: Diagnosis not present

## 2019-02-07 DIAGNOSIS — Z01419 Encounter for gynecological examination (general) (routine) without abnormal findings: Secondary | ICD-10-CM | POA: Diagnosis not present

## 2019-02-07 DIAGNOSIS — Z6841 Body Mass Index (BMI) 40.0 and over, adult: Secondary | ICD-10-CM | POA: Diagnosis not present

## 2019-02-07 MED FILL — SERTRALINE HCL 50 MG TABLET: 50 | 90 days supply | Qty: 90 | Fill #0

## 2019-02-07 MED FILL — PHENTERMINE 37.5 MG TABLET: 37.5 | 30 days supply | Qty: 30 | Fill #0

## 2019-03-09 DIAGNOSIS — E669 Obesity, unspecified: Secondary | ICD-10-CM | POA: Diagnosis not present

## 2019-03-09 DIAGNOSIS — F411 Generalized anxiety disorder: Secondary | ICD-10-CM | POA: Diagnosis not present

## 2019-03-09 DIAGNOSIS — R300 Dysuria: Secondary | ICD-10-CM | POA: Diagnosis not present

## 2019-03-09 MED FILL — PHENTERMINE 37.5 MG TABLET: 37.5 | 30 days supply | Qty: 30 | Fill #1

## 2019-04-06 MED FILL — PHENTERMINE 37.5 MG TABLET: 37.5 | 30 days supply | Qty: 30 | Fill #2

## 2019-04-20 MED FILL — SERTRALINE HCL 50 MG TABLET: 50 | 90 days supply | Qty: 90 | Fill #1

## 2019-07-02 DIAGNOSIS — Z0000 Encounter for general adult medical examination without abnormal findings: Secondary | ICD-10-CM | POA: Diagnosis not present

## 2019-07-09 DIAGNOSIS — Z0001 Encounter for general adult medical examination with abnormal findings: Secondary | ICD-10-CM | POA: Diagnosis not present

## 2019-07-09 DIAGNOSIS — E669 Obesity, unspecified: Secondary | ICD-10-CM | POA: Diagnosis not present

## 2019-07-09 DIAGNOSIS — F411 Generalized anxiety disorder: Secondary | ICD-10-CM | POA: Diagnosis not present

## 2019-07-09 MED FILL — PHENTERMINE 37.5 MG TABLET: 37.5 | 30 days supply | Qty: 30 | Fill #0

## 2019-07-19 MED FILL — SERTRALINE HCL 50 MG TABLET: 50 | 90 days supply | Qty: 90 | Fill #2

## 2019-08-06 MED FILL — PHENTERMINE 37.5 MG TABLET: 37.5 | 30 days supply | Qty: 30 | Fill #1

## 2019-08-08 DIAGNOSIS — E669 Obesity, unspecified: Secondary | ICD-10-CM | POA: Diagnosis not present

## 2019-08-08 DIAGNOSIS — Z6839 Body mass index (BMI) 39.0-39.9, adult: Secondary | ICD-10-CM | POA: Diagnosis not present

## 2019-08-08 DIAGNOSIS — F411 Generalized anxiety disorder: Secondary | ICD-10-CM | POA: Diagnosis not present

## 2019-09-05 MED FILL — PHENTERMINE 37.5 MG TABLET: 37.5 | 30 days supply | Qty: 30 | Fill #2

## 2019-10-03 DIAGNOSIS — Z6839 Body mass index (BMI) 39.0-39.9, adult: Secondary | ICD-10-CM | POA: Diagnosis not present

## 2019-10-03 DIAGNOSIS — E669 Obesity, unspecified: Secondary | ICD-10-CM | POA: Diagnosis not present

## 2019-10-03 DIAGNOSIS — F411 Generalized anxiety disorder: Secondary | ICD-10-CM | POA: Diagnosis not present

## 2019-10-08 MED FILL — PHENTERMINE 37.5 MG TABLET: 37.5 | 30 days supply | Qty: 30 | Fill #0

## 2019-10-17 MED FILL — SERTRALINE HCL 50 MG TABLET: 50 | 90 days supply | Qty: 90 | Fill #3

## 2019-11-05 MED FILL — PHENTERMINE 37.5 MG TABLET: 37.5 | 30 days supply | Qty: 30 | Fill #1

## 2019-11-07 ENCOUNTER — Other Ambulatory Visit: Payer: Self-pay

## 2019-11-07 DIAGNOSIS — Z20822 Contact with and (suspected) exposure to covid-19: Secondary | ICD-10-CM

## 2019-11-09 LAB — NOVEL CORONAVIRUS, NAA: SARS-CoV-2, NAA: NOT DETECTED

## 2019-12-05 MED FILL — PHENTERMINE 37.5 MG TABLET: 37.5 | 30 days supply | Qty: 30 | Fill #0

## 2019-12-21 HISTORY — PX: CARPAL TUNNEL RELEASE: SHX101

## 2020-01-02 MED FILL — PHENTERMINE 37.5 MG TABLET: 37.5 | 30 days supply | Qty: 30 | Fill #1

## 2020-01-16 MED FILL — SERTRALINE HCL 50 MG TABLET: 50 | 90 days supply | Qty: 90 | Fill #0

## 2020-02-11 DIAGNOSIS — Z6838 Body mass index (BMI) 38.0-38.9, adult: Secondary | ICD-10-CM | POA: Diagnosis not present

## 2020-02-11 DIAGNOSIS — Z01419 Encounter for gynecological examination (general) (routine) without abnormal findings: Secondary | ICD-10-CM | POA: Diagnosis not present

## 2020-04-10 ENCOUNTER — Other Ambulatory Visit (HOSPITAL_COMMUNITY): Payer: Self-pay | Admitting: Obstetrics and Gynecology

## 2020-04-10 MED FILL — SERTRALINE HCL 50 MG TABLET: 50 | 90 days supply | Qty: 90 | Fill #0

## 2020-04-16 DIAGNOSIS — Z1231 Encounter for screening mammogram for malignant neoplasm of breast: Secondary | ICD-10-CM | POA: Diagnosis not present

## 2020-04-16 DIAGNOSIS — M79642 Pain in left hand: Secondary | ICD-10-CM | POA: Diagnosis not present

## 2020-04-16 DIAGNOSIS — G5603 Carpal tunnel syndrome, bilateral upper limbs: Secondary | ICD-10-CM | POA: Diagnosis not present

## 2020-04-16 DIAGNOSIS — M79641 Pain in right hand: Secondary | ICD-10-CM | POA: Diagnosis not present

## 2020-05-12 DIAGNOSIS — G5603 Carpal tunnel syndrome, bilateral upper limbs: Secondary | ICD-10-CM | POA: Diagnosis not present

## 2020-07-03 MED FILL — SERTRALINE HCL 50 MG TABS: 50 | 90 days supply | Qty: 90 | Fill #1

## 2020-08-07 DIAGNOSIS — Q828 Other specified congenital malformations of skin: Secondary | ICD-10-CM | POA: Diagnosis not present

## 2020-08-07 DIAGNOSIS — D485 Neoplasm of uncertain behavior of skin: Secondary | ICD-10-CM | POA: Diagnosis not present

## 2020-08-07 DIAGNOSIS — L570 Actinic keratosis: Secondary | ICD-10-CM | POA: Diagnosis not present

## 2020-10-01 MED FILL — SERTRALINE HCL 50 MG TABLET: 50 | 90 days supply | Qty: 90 | Fill #2

## 2020-11-04 DIAGNOSIS — H524 Presbyopia: Secondary | ICD-10-CM | POA: Diagnosis not present

## 2020-11-04 DIAGNOSIS — H52221 Regular astigmatism, right eye: Secondary | ICD-10-CM | POA: Diagnosis not present

## 2020-11-04 DIAGNOSIS — H5212 Myopia, left eye: Secondary | ICD-10-CM | POA: Diagnosis not present

## 2020-11-20 DIAGNOSIS — G5601 Carpal tunnel syndrome, right upper limb: Secondary | ICD-10-CM | POA: Diagnosis not present

## 2020-11-20 DIAGNOSIS — Z4789 Encounter for other orthopedic aftercare: Secondary | ICD-10-CM | POA: Diagnosis not present

## 2020-12-04 DIAGNOSIS — M79641 Pain in right hand: Secondary | ICD-10-CM | POA: Diagnosis not present

## 2020-12-18 DIAGNOSIS — G5602 Carpal tunnel syndrome, left upper limb: Secondary | ICD-10-CM | POA: Diagnosis not present

## 2020-12-18 DIAGNOSIS — Z4789 Encounter for other orthopedic aftercare: Secondary | ICD-10-CM | POA: Diagnosis not present

## 2021-03-10 DIAGNOSIS — Z30433 Encounter for removal and reinsertion of intrauterine contraceptive device: Secondary | ICD-10-CM | POA: Diagnosis not present

## 2021-05-05 DIAGNOSIS — Z1231 Encounter for screening mammogram for malignant neoplasm of breast: Secondary | ICD-10-CM | POA: Diagnosis not present

## 2021-05-05 DIAGNOSIS — Z6838 Body mass index (BMI) 38.0-38.9, adult: Secondary | ICD-10-CM | POA: Diagnosis not present

## 2021-05-05 DIAGNOSIS — Z01419 Encounter for gynecological examination (general) (routine) without abnormal findings: Secondary | ICD-10-CM | POA: Diagnosis not present

## 2021-11-17 ENCOUNTER — Other Ambulatory Visit: Payer: Self-pay

## 2021-11-17 ENCOUNTER — Ambulatory Visit
Admission: EM | Admit: 2021-11-17 | Discharge: 2021-11-17 | Disposition: A | Discharge status: Home / Self Care | Payer: 59 | Attending: Student | Admitting: Student

## 2021-11-17 DIAGNOSIS — N390 Urinary tract infection, site not specified: Secondary | ICD-10-CM | POA: Diagnosis not present

## 2021-11-17 LAB — POCT URINALYSIS DIP (MANUAL ENTRY)
Bilirubin, UA: NEGATIVE
Glucose, UA: NEGATIVE mg/dL
Nitrite, UA: NEGATIVE
Protein Ur, POC: NEGATIVE mg/dL
Spec Grav, UA: <=1.005 — AB (ref 1.010–1.025)
Urobilinogen, UA: 0.2 E.U./dL
pH, UA: 5.5 (ref 5.0–8.0)

## 2021-11-17 MED ORDER — NITROFURANTOIN MONOHYD MACRO 100 MG PO CAPS: 100.0000 mg | ORAL_CAPSULE | Freq: Two times a day (BID) | ORAL | 0 refills | Status: DC

## 2021-11-17 NOTE — ED Provider Notes (Signed)
[facility]    CSN: [id number] Arrival date & time: 11/17/21  1037      History   Chief Complaint No chief complaint on file.   HPI Colleen Floyd is a 45 y.o. female presenting with urinary symptoms for about 4 days.  Medical history UTI years ago per patient.  Describes 3 days of suprapubic pressure and bladder spasms, dysuria, urinary frequency.  Has taken an Advil for the symptoms with some improvement, but then the discomfort returns. Denies hematuria, urgency, back pain, n/v/d, fevers/chills, abdnormal vaginal discharge. IUD contraception   HPI  Past Medical History:  Diagnosis Date   Allergy    seasonal   Anemia     There are no problems to display for this patient.   Past Surgical History:  Procedure Laterality Date   APPENDECTOMY     CESAREAN SECTION     x2    OB History   No obstetric history on file.      Home Medications    Prior to Admission medications   Medication Sig Start Date End Date Taking? Authorizing Provider  Multiple Vitamin (MULTIVITAMIN WITH MINERALS) TABS tablet Take 1 tablet by mouth daily.   Yes [provider]  nitrofurantoin, macrocrystal-monohydrate, (MACROBID) 100 MG capsule Take 1 capsule (100 mg total) by mouth 2 (two) times daily. 11/17/21  Yes Hazel Sams, PA-C  cetirizine (ZYRTEC) 10 MG tablet Take 10 mg by mouth daily as needed for allergies.    [provider]  fluticasone (FLONASE) 50 MCG/ACT nasal spray Place 2 sprays into the nose daily. Two sprays each nostril daily 03/06/13   Susy Frizzle, MD  loratadine-pseudoephedrine (CLARITIN-D 24-HOUR) 10-240 MG 24 hr tablet Take 1 tablet by mouth daily as needed for allergies.    [provider]  sertraline (ZOLOFT) 50 MG tablet TAKE 1 TABLET BY MOUTH ONCE DAILY 04/10/20 04/10/21  Dian Queen, MD    Family History Family History  Problem Relation Age of Onset   Cancer Mother        lung   Asthma Mother    Neuropathy Mother     Atrial fibrillation Father    Atrial fibrillation Brother    Cancer Paternal Uncle        colon   Cancer Maternal Grandmother        colon   Cancer Cousin        colon    Social History Social History   Tobacco Use   Smoking status: Never   Smokeless tobacco: Never  Vaping Use   Vaping Use: Never used  Substance Use Topics   Alcohol use: No   Drug use: No     Allergies   Penicillins and Sulfa antibiotics   Review of Systems Review of Systems  Constitutional:  Negative for chills and fever.  HENT:  Negative for sore throat.   Eyes:  Negative for pain and redness.  Respiratory:  Negative for shortness of breath.   Cardiovascular:  Negative for chest pain.  Gastrointestinal:  Positive for abdominal pain. Negative for diarrhea, nausea and vomiting.  Genitourinary:  Positive for dysuria and frequency. Negative for decreased urine volume, difficulty urinating, flank pain, genital sores, hematuria and urgency.  Musculoskeletal:  Negative for back pain.  Skin:  Negative for rash.  All other systems reviewed and are negative.   Physical Exam Triage Vital Signs ED Triage Vitals  Enc Vitals Group     BP 11/17/21 1431 115/78  Pulse Rate 11/17/21 1431 71     Resp 11/17/21 1431 18     Temp 11/17/21 1431 98.6 F (37 C)     Temp Source 11/17/21 1431 Oral     SpO2 11/17/21 1431 97 %     Weight --      Height --      Head Circumference --      Peak Flow --      Pain Score 11/17/21 1429 6     Pain Loc --      Pain Edu? --      Excl. in [place]? --    No data found.  Updated Vital Signs BP 115/78 (BP Location: Right Arm)   Pulse 71   Temp 98.6 F (37 C) (Oral)   Resp 18   SpO2 97%   Visual Acuity Right Eye Distance:   Left Eye Distance:   Bilateral Distance:    Right Eye Near:   Left Eye Near:    Bilateral Near:     Physical Exam Vitals reviewed.  Constitutional:      General: She is not in acute distress.    Appearance: Normal appearance. She is not  ill-appearing.  HENT:     Head: Normocephalic and atraumatic.     Mouth/Throat:     Mouth: Mucous membranes are moist.     Comments: Moist mucous membranes Eyes:     Extraocular Movements: Extraocular movements intact.     Pupils: Pupils are equal, round, and reactive to light.  Cardiovascular:     Rate and Rhythm: Normal rate and regular rhythm.     Heart sounds: Normal heart sounds.  Pulmonary:     Effort: Pulmonary effort is normal.     Breath sounds: Normal breath sounds. No wheezing, rhonchi or rales.  Abdominal:     General: Bowel sounds are normal. There is no distension.     Palpations: Abdomen is soft. There is no mass.     Tenderness: There is no abdominal tenderness. There is no right CVA tenderness, left CVA tenderness, guarding or rebound.  Skin:    General: Skin is warm.     Capillary Refill: Capillary refill takes less than 2 seconds.     Comments: Good skin turgor  Neurological:     General: No focal deficit present.     Mental Status: She is alert and oriented to person, place, and time.  Psychiatric:        Mood and Affect: Mood normal.        Behavior: Behavior normal.     UC Treatments / Results  Labs (all labs ordered are listed, but only abnormal results are displayed) Labs Reviewed  POCT URINALYSIS DIP (MANUAL ENTRY) - Abnormal; Notable for the following components:      Result Value   Ketones, POC UA small (15) (*)    Spec Grav, UA <=1.005 (*)    Blood, UA moderate (*)    Leukocytes, UA Small (1+) (*)    All other components within normal limits  URINE CULTURE    EKG   Radiology No results found.  Procedures Procedures (including critical care time)  Medications Ordered in UC Medications - No data to display  Initial Impression / Assessment and Plan / UC Course  I have reviewed the triage vital signs and the nursing notes.  Pertinent labs & imaging results that were available during my care of the patient were reviewed by me and  considered in my medical  decision making (see chart for details).     This patient is a very pleasant 45 y.o. year old female presenting with acute cystitis with hematuria. Afebrile, nontachycardic, no reproducible abd pain or CVAT.  UA with moderate blood and small leuk. Culture sent. She is penicillin and sulfa allergic. Macrobid sent. IUD contraception.   ED return precautions discussed. Patient verbalizes understanding and agreement.    Final Clinical Impressions(s) / UC Diagnoses   Final diagnoses:  Urinary tract infection without hematuria, site unspecified     Discharge Instructions      -Macrobid twice daily x5 days -We'll call in 2-3 days if we need to change anything -Make sure to drink plenty of water -Follow-up if symptoms getting worse instead of better- new fevers/chills, worsening abd pain, new/worsening back pain, etc   ED Prescriptions     Medication Sig Dispense Auth. Provider   nitrofurantoin, macrocrystal-monohydrate, (MACROBID) 100 MG capsule Take 1 capsule (100 mg total) by mouth 2 (two) times daily. 10 capsule Hazel Sams, PA-C      PDMP not reviewed this encounter.   Hazel Sams, PA-C 11/17/21 1548

## 2021-11-17 NOTE — Discharge Instructions (Addendum)
-  Macrobid twice daily x5 days -We'll call in 2-3 days if we need to change anything -Make sure to drink plenty of water -Follow-up if symptoms getting worse instead of better- new fevers/chills, worsening abd pain, new/worsening back pain, etc

## 2021-11-17 NOTE — ED Triage Notes (Signed)
Patient states she thinks she has a UTI that started on Saturday evening. It burns when she urinates with bladder spasms.  Symptom became better up until last night.  She took Advil this morning at 3 am for the pain without any relief.

## 2021-11-20 LAB — URINE CULTURE: Culture: >=100000 — AB

## 2022-01-13 ENCOUNTER — Other Ambulatory Visit: Payer: Self-pay

## 2022-01-13 ENCOUNTER — Other Ambulatory Visit (HOSPITAL_COMMUNITY): Payer: Self-pay

## 2022-01-13 MED ORDER — SERTRALINE HCL 50 MG PO TABS
50.0000 mg | ORAL_TABLET | Freq: Every day | ORAL | 1 refills | Status: DC
  Filled 2022-01-13: qty 90, 90d supply, fill #0
  Filled 2022-05-12: qty 90, 90d supply, fill #1

## 2022-05-12 ENCOUNTER — Other Ambulatory Visit (HOSPITAL_COMMUNITY): Payer: Self-pay

## 2022-06-17 ENCOUNTER — Other Ambulatory Visit (HOSPITAL_COMMUNITY): Payer: Self-pay

## 2022-06-17 DIAGNOSIS — Z01419 Encounter for gynecological examination (general) (routine) without abnormal findings: Secondary | ICD-10-CM | POA: Diagnosis not present

## 2022-06-17 DIAGNOSIS — Z6836 Body mass index (BMI) 36.0-36.9, adult: Secondary | ICD-10-CM | POA: Diagnosis not present

## 2022-06-17 DIAGNOSIS — Z1231 Encounter for screening mammogram for malignant neoplasm of breast: Secondary | ICD-10-CM | POA: Diagnosis not present

## 2022-06-17 DIAGNOSIS — Z124 Encounter for screening for malignant neoplasm of cervix: Secondary | ICD-10-CM | POA: Diagnosis not present

## 2022-06-17 MED ORDER — ALPRAZOLAM 0.5 MG PO TABS
ORAL_TABLET | ORAL | 3 refills | Status: DC
  Filled 2022-06-17: qty 30, 30d supply, fill #0
  Filled 2022-08-20: qty 30, 30d supply, fill #1
  Filled 2022-10-26: qty 30, 30d supply, fill #2
  Filled 2022-12-03: qty 30, 30d supply, fill #3

## 2022-06-17 MED ORDER — SERTRALINE HCL 50 MG PO TABS
50.0000 mg | ORAL_TABLET | Freq: Every day | ORAL | 3 refills | Status: DC
  Filled 2022-06-17 – 2022-08-20 (×2): qty 90, 90d supply, fill #0
  Filled 2023-04-05: qty 90, 90d supply, fill #1

## 2022-07-22 ENCOUNTER — Other Ambulatory Visit (HOSPITAL_COMMUNITY): Payer: Self-pay

## 2022-07-22 MED ORDER — NITROFURANTOIN MONOHYD MACRO 100 MG PO CAPS
100.0000 mg | ORAL_CAPSULE | Freq: Two times a day (BID) | ORAL | 0 refills | Status: DC
  Filled 2022-07-22: qty 14, 7d supply, fill #0

## 2022-08-20 ENCOUNTER — Other Ambulatory Visit (HOSPITAL_COMMUNITY): Payer: Self-pay

## 2022-08-25 ENCOUNTER — Ambulatory Visit: Payer: 59 | Admitting: Family Medicine

## 2022-09-01 ENCOUNTER — Ambulatory Visit: Payer: 59 | Admitting: Family Medicine

## 2022-09-01 ENCOUNTER — Encounter: Payer: Self-pay | Admitting: Internal Medicine

## 2022-09-01 VITALS — BP 112/77 | HR 70 | Temp 98.1°F | Ht 64.0 in | Wt 212.0 lb

## 2022-09-01 DIAGNOSIS — F32A Depression, unspecified: Secondary | ICD-10-CM | POA: Diagnosis not present

## 2022-09-01 DIAGNOSIS — Z0000 Encounter for general adult medical examination without abnormal findings: Secondary | ICD-10-CM | POA: Diagnosis not present

## 2022-09-01 DIAGNOSIS — F419 Anxiety disorder, unspecified: Secondary | ICD-10-CM | POA: Diagnosis not present

## 2022-09-01 DIAGNOSIS — Z1211 Encounter for screening for malignant neoplasm of colon: Secondary | ICD-10-CM

## 2022-09-01 DIAGNOSIS — Z13220 Encounter for screening for lipoid disorders: Secondary | ICD-10-CM

## 2022-09-01 DIAGNOSIS — R1011 Right upper quadrant pain: Secondary | ICD-10-CM

## 2022-09-01 DIAGNOSIS — E669 Obesity, unspecified: Secondary | ICD-10-CM | POA: Diagnosis not present

## 2022-09-01 NOTE — Assessment & Plan Note (Addendum)
Stable.  Continue current medications.

## 2022-09-01 NOTE — Assessment & Plan Note (Signed)
Arranging [place] for further evaluation.

## 2022-09-01 NOTE — Patient Instructions (Signed)
Labs ordered.  Referral placed.  You will receive a call to schedule the [place]. If you don't hear back within 1 week, please call.  Follow up annually.  Take care  Dr. Lacinda Axon

## 2022-09-02 DIAGNOSIS — Z0000 Encounter for general adult medical examination without abnormal findings: Secondary | ICD-10-CM | POA: Insufficient documentation

## 2022-09-02 LAB — CMP14+EGFR
ALT: 13 IU/L (ref 0–32)
AST: 17 IU/L (ref 0–40)
Albumin/Globulin Ratio: 1.6 (ref 1.2–2.2)
Albumin: 4.1 g/dL (ref 3.9–4.9)
Alkaline Phosphatase: 54 IU/L (ref 44–121)
BUN/Creatinine Ratio: 17 (ref 9–23)
BUN: 14 mg/dL (ref 6–24)
Bilirubin Total: 0.6 mg/dL (ref 0.0–1.2)
CO2: 24 mmol/L (ref 20–29)
Calcium: 9 mg/dL (ref 8.7–10.2)
Chloride: 106 mmol/L (ref 96–106)
Creatinine, Ser: 0.83 mg/dL (ref 0.57–1.00)
Globulin, Total: 2.5 g/dL (ref 1.5–4.5)
Glucose: 79 mg/dL (ref 70–99)
Potassium: 4.2 mmol/L (ref 3.5–5.2)
Sodium: 144 mmol/L (ref 134–144)
Total Protein: 6.6 g/dL (ref 6.0–8.5)
eGFR: 89 mL/min/{1.73_m2} (ref >59)

## 2022-09-02 LAB — CBC
Hematocrit: 41.2 % (ref 34.0–46.6)
Hemoglobin: 13.7 g/dL (ref 11.1–15.9)
MCH: 31.7 pg (ref 26.6–33.0)
MCHC: 33.3 g/dL (ref 31.5–35.7)
MCV: 95 fL (ref 79–97)
Platelets: 203 10*3/uL (ref 150–450)
RBC: 4.32 x10E6/uL (ref 3.77–5.28)
RDW: 12.7 % (ref 11.7–15.4)
WBC: 3.9 10*3/uL (ref 3.4–10.8)

## 2022-09-02 LAB — TSH: TSH: 1.18 u[IU]/mL (ref 0.450–4.500)

## 2022-09-02 LAB — LIPID PANEL
Chol/HDL Ratio: 2.6 ratio (ref 0.0–4.4)
Cholesterol, Total: 164 mg/dL (ref 100–199)
HDL: 64 mg/dL (ref >39)
LDL Chol Calc (NIH): 88 mg/dL (ref 0–99)
Triglycerides: 63 mg/dL (ref 0–149)
VLDL Cholesterol Cal: 12 mg/dL (ref 5–40)

## 2022-09-02 NOTE — Assessment & Plan Note (Signed)
Placing referral for colonoscopy. Declines HIV and hepatitis C screening. Patient sees OB/GYN regarding Pap smear and mammograms. Labs today. She will get her influenza vaccine through work.

## 2022-09-02 NOTE — Progress Notes (Signed)
Subjective:  Patient ID: Colleen Floyd, female    DOB: 08-02-1976  Age: 46 y.o. MRN: [id number]  CC: Chief Complaint  Patient presents with   Establish Care    HPI:  46 year old female presents to establish care.  Has been quite sometime since she has seen a physician.  Patient reports that she suffers from anxiety and depression.  Is currently taking Xanax sparingly and takes the Zoloft daily.  She states that her anxiety and depression is stable at this time.  Patient declines influenza vaccine today.  She gets these via our health system as she works for [employer].  She is overdue for colonoscopy.  She would like me to place referral to Dr. Carlean Purl.  Declines HIV and hepatitis C screening.  Additionally, patient reports intermittent right upper quadrant pain.  She states that this has been going on for quite some time.  She is concerned that she needs a cholecystectomy.  She has had no work-up for this.  No current pain at this time.  No reports of nausea or vomiting.  Patient Active Problem List   Diagnosis Date Noted   Preventative health care 09/02/2022   Anxiety and depression 09/01/2022   Obesity (BMI 30-39.9) 09/01/2022   RUQ pain 09/01/2022    Social Hx   Social History   Socioeconomic History   Marital status: Married    Spouse name: Not on file   Number of children: Not on file   Years of education: Not on file   Highest education level: Not on file  Occupational History   Not on file  Tobacco Use   Smoking status: Never   Smokeless tobacco: Never  Vaping Use   Vaping Use: Never used  Substance and Sexual Activity   Alcohol use: No   Drug use: No   Sexual activity: Yes    Birth control/protection: I.U.D.  Other Topics Concern   Not on file  Social History Narrative   Not on file   Social Determinants of Health   Financial Resource Strain: Not on file  Food Insecurity: Not on file  Transportation Needs: Not on file  Physical Activity: Not on  file  Stress: Not on file  Social Connections: Not on file    Review of Systems Per HPI  Objective:  BP 112/77   Pulse 70   Temp 98.1 F (36.7 C)   Ht 5' 4"  (1.626 m)   Wt 212 lb (96.2 kg)   SpO2 97%   BMI 36.39 kg/m      09/01/2022    9:45 AM 11/17/2021    2:31 PM 06/02/2018    9:24 AM  BP/Weight  Systolic BP 952 841 324  Diastolic BP 77 78 72  Wt. (Lbs) 212  236  BMI 36.39 kg/m2  40.51 kg/m2    Physical Exam Constitutional:      General: She is not in acute distress.    Appearance: Normal appearance. She is obese.  HENT:     Head: Normocephalic and atraumatic.  Eyes:     General:        Right eye: No discharge.        Left eye: No discharge.     Conjunctiva/sclera: Conjunctivae normal.  Cardiovascular:     Rate and Rhythm: Normal rate and regular rhythm.  Pulmonary:     Effort: Pulmonary effort is normal.     Breath sounds: Normal breath sounds. No wheezing, rhonchi or rales.  Abdominal:  General: There is no distension.     Palpations: Abdomen is soft.     Tenderness: There is no abdominal tenderness.  Neurological:     Mental Status: She is alert.  Psychiatric:        Mood and Affect: Mood normal.        Behavior: Behavior normal.     Lab Results  Component Value Date   WBC 3.9 09/01/2022   HGB 13.7 09/01/2022   HCT 41.2 09/01/2022   PLT 203 09/01/2022   GLUCOSE 79 09/01/2022   CHOL 164 09/01/2022   TRIG 63 09/01/2022   HDL 64 09/01/2022   LDLCALC 88 09/01/2022   ALT 13 09/01/2022   AST 17 09/01/2022   NA 144 09/01/2022   K 4.2 09/01/2022   CL 106 09/01/2022   CREATININE 0.83 09/01/2022   BUN 14 09/01/2022   CO2 24 09/01/2022   TSH 1.180 09/01/2022     Assessment & Plan:   Problem List Items Addressed This Visit       Other   Anxiety and depression - Primary    Stable.  Continue current medications.      Obesity (BMI 30-39.9)   Relevant Orders   TSH (Completed)   Preventative health care    Placing referral for  colonoscopy. Declines HIV and hepatitis C screening. Patient sees OB/GYN regarding Pap smear and mammograms. Labs today. She will get her influenza vaccine through work.      RUQ pain    Arranging [place] for further evaluation.      Relevant Orders   CBC (Completed)   CMP14+EGFR (Completed)   US Abdomen Limited RUQ (LIVER/GB)   Other Visit Diagnoses     Screening for colon cancer       Relevant Orders   Ambulatory referral to Gastroenterology   Screening, lipid       Relevant Orders   Lipid panel (Completed)      Follow-up:  Return in about 1 year (around 09/02/2023).  [place]

## 2022-09-15 ENCOUNTER — Ambulatory Visit (HOSPITAL_COMMUNITY): Payer: 59

## 2022-09-17 ENCOUNTER — Ambulatory Visit (HOSPITAL_COMMUNITY)
Admission: RE | Admit: 2022-09-17 | Discharge: 2022-09-17 | Disposition: A | Discharge status: Home / Self Care | Payer: 59 | Source: Ambulatory Visit | Attending: Family Medicine | Admitting: Family Medicine

## 2022-09-17 DIAGNOSIS — R1011 Right upper quadrant pain: Secondary | ICD-10-CM | POA: Insufficient documentation

## 2022-09-22 ENCOUNTER — Ambulatory Visit (AMBULATORY_SURGERY_CENTER): Payer: Self-pay

## 2022-09-22 VITALS — Ht 64.0 in | Wt 214.0 lb

## 2022-09-22 DIAGNOSIS — Z1211 Encounter for screening for malignant neoplasm of colon: Secondary | ICD-10-CM

## 2022-09-22 NOTE — Progress Notes (Signed)
No egg or soy allergy known to patient  No issues known to pt with past sedation with any surgeries or procedures Patient denies ever being told they had issues or difficulty with intubation  No FH of Malignant Hyperthermia Pt is not on diet pills Pt is not on  home 02  Pt is not on blood thinners  Pt denies issues with constipation  No A fib or A flutter Have any cardiac testing pending--no Pt instructed to use [email or web address] or GoodRx for a price reduction on prep   

## 2022-10-12 ENCOUNTER — Encounter: Payer: Self-pay | Admitting: Internal Medicine

## 2022-10-14 ENCOUNTER — Encounter: Payer: Self-pay | Admitting: Certified Registered Nurse Anesthetist

## 2022-10-20 ENCOUNTER — Ambulatory Visit (AMBULATORY_SURGERY_CENTER): Payer: 59 | Admitting: Internal Medicine

## 2022-10-20 ENCOUNTER — Encounter: Payer: Self-pay | Admitting: Internal Medicine

## 2022-10-20 VITALS — BP 112/77 | HR 63 | Temp 97.3°F | Resp 13 | Ht 64.0 in | Wt 214.0 lb

## 2022-10-20 DIAGNOSIS — Z1211 Encounter for screening for malignant neoplasm of colon: Secondary | ICD-10-CM

## 2022-10-20 DIAGNOSIS — F419 Anxiety disorder, unspecified: Secondary | ICD-10-CM | POA: Diagnosis not present

## 2022-10-20 DIAGNOSIS — F32A Depression, unspecified: Secondary | ICD-10-CM | POA: Diagnosis not present

## 2022-10-20 MED ORDER — SODIUM CHLORIDE 0.9 % IV SOLN: 500.0000 mL | Freq: Once | INTRAVENOUS | Status: DC

## 2022-10-20 NOTE — Progress Notes (Signed)
2023 Into room and review with patient. vss

## 2022-10-20 NOTE — Progress Notes (Signed)
[facility] History and Physical   Primary Care Physician:  Coral Spikes, DO   Reason for Procedure:   CRCA screen  Plan:    colonoscopy     HPI: Colleen Floyd is a 46 y.o. female here for screening colonoscopy   Past Medical History:  Diagnosis Date   Allergy    seasonal   Anxiety    Depression     Past Surgical History:  Procedure Laterality Date   APPENDECTOMY     CARPAL TUNNEL RELEASE  2021   CESAREAN SECTION     x2    Prior to Admission medications   Medication Sig Start Date End Date Taking? Authorizing Provider  cetirizine (ZYRTEC) 10 MG tablet Take 10 mg by mouth daily as needed for allergies.   Yes [provider]  loratadine-pseudoephedrine (CLARITIN-D 24-HOUR) 10-240 MG 24 hr tablet Take 1 tablet by mouth daily as needed for allergies.   Yes [provider]  Multiple Vitamin (MULTIVITAMIN WITH MINERALS) TABS tablet Take 1 tablet by mouth daily.   Yes [provider]  sertraline (ZOLOFT) 50 MG tablet TAKE 1 TABLET BY MOUTH ONCE DAILY 06/17/22  Yes   ALPRAZolam (XANAX) 0.5 MG tablet Take 1 tablet by mouth once daily as needed Patient not taking: Reported on 09/22/2022 06/17/22     fluticasone (FLONASE) 50 MCG/ACT nasal spray Place 2 sprays into the nose daily. Two sprays each nostril daily 03/06/13   Susy Frizzle, MD    Current Outpatient Medications  Medication Sig Dispense Refill   cetirizine (ZYRTEC) 10 MG tablet Take 10 mg by mouth daily as needed for allergies.     loratadine-pseudoephedrine (CLARITIN-D 24-HOUR) 10-240 MG 24 hr tablet Take 1 tablet by mouth daily as needed for allergies.     Multiple Vitamin (MULTIVITAMIN WITH MINERALS) TABS tablet Take 1 tablet by mouth daily.     sertraline (ZOLOFT) 50 MG tablet TAKE 1 TABLET BY MOUTH ONCE DAILY 90 tablet 3   ALPRAZolam (XANAX) 0.5 MG tablet Take 1 tablet by mouth once daily as needed (Patient not taking: Reported on 09/22/2022) 30 tablet 3   fluticasone  (FLONASE) 50 MCG/ACT nasal spray Place 2 sprays into the nose daily. Two sprays each nostril daily     Current Facility-Administered Medications  Medication Dose Route Frequency Provider Last Rate Last Admin   0.9 %  sodium chloride infusion  500 mL Intravenous Once Gatha Mayer, MD        Allergies as of 10/20/2022 - Review Complete 10/20/2022  Allergen Reaction Noted   Penicillins  03/06/2013   Sulfa antibiotics  03/06/2013    Family History  Problem Relation Age of Onset   Cancer Mother        lung   Asthma Mother    Neuropathy Mother    Colon polyps Father    Atrial fibrillation Father    Colon polyps Brother    Atrial fibrillation Brother    Colon cancer Paternal Uncle    Cancer Paternal Uncle        colon   Colon cancer Maternal Grandmother    Cancer Maternal Grandmother        colon   Colon cancer Cousin    Cancer Cousin        colon   Esophageal cancer Neg Hx    Rectal cancer Neg Hx    Stomach cancer Neg Hx     Social History   Socioeconomic History   Marital status: Married  Spouse name: Not on file   Number of children: Not on file   Years of education: Not on file   Highest education level: Not on file  Occupational History   Not on file  Tobacco Use   Smoking status: Never   Smokeless tobacco: Never  Vaping Use   Vaping Use: Never used  Substance and Sexual Activity   Alcohol use: No   Drug use: No   Sexual activity: Yes    Birth control/protection: I.U.D.  Other Topics Concern   Not on file  Social History Narrative   Not on file   Social Determinants of Health   Financial Resource Strain: Not on file  Food Insecurity: Not on file  Transportation Needs: Not on file  Physical Activity: Not on file  Stress: Not on file  Social Connections: Not on file  Intimate Partner Violence: Not on file    Review of Systems:  All other review of systems negative except as mentioned in the HPI.  Physical Exam: Vital signs BP 135/73    Pulse 72   Temp (!) 97.3 F (36.3 C)   Ht '5\' 4"'$  (1.626 m)   Wt 214 lb (97.1 kg)   SpO2 100%   BMI 36.73 kg/m   General:   Alert,  Well-developed, well-nourished, pleasant and cooperative in NAD Lungs:  Clear throughout to auscultation.   Heart:  Regular rate and rhythm; no murmurs, clicks, rubs,  or gallops. Abdomen:  Soft, nontender and nondistended. Normal bowel sounds.   Neuro/Psych:  Alert and cooperative. Normal mood and affect. A and O x 3   '@Kasi Lasky'$  Simonne Maffucci, MD, [facility] [phone] (pager) 10/20/2022 8:41 AM@

## 2022-10-20 NOTE — Op Note (Signed)
[facility] Patient Name: Colleen Floyd Procedure Date: 10/20/2022 8:44 AM MRN: [id number] Endoscopist: Gatha Mayer , MD, [id number] Age: 46 Referring MD:  Date of Birth: 1976-02-24 Gender: Female Account #: [id number] Procedure:                Colonoscopy Indications:              Screening for colorectal malignant neoplasm, This                            is the patient's first colonoscopy Medicines:                Monitored Anesthesia Care Procedure:                Pre-Anesthesia Assessment:                           - Prior to the procedure, a History and Physical                            was performed, and patient medications and                            allergies were reviewed. The patient's tolerance of                            previous anesthesia was also reviewed. The risks                            and benefits of the procedure and the sedation                            options and risks were discussed with the patient.                            All questions were answered, and informed consent                            was obtained. Prior Anticoagulants: The patient has                            taken no anticoagulant or antiplatelet agents. ASA                            Grade Assessment: II - A patient with mild systemic                            disease. After reviewing the risks and benefits,                            the patient was deemed in satisfactory condition to                            undergo the procedure.  After obtaining informed consent, the colonoscope                            was passed under direct vision. Throughout the                            procedure, the patient's blood pressure, pulse, and                            oxygen saturations were monitored continuously. The                            Olympus CF-HQ190L ([id number]) Colonoscope was                            introduced through the anus  and advanced to the the                            cecum, identified by appendiceal orifice and                            ileocecal valve. The colonoscopy was somewhat                            difficult due to significant looping. Successful                            completion of the procedure was aided by applying                            abdominal pressure. The patient tolerated the                            procedure well. The quality of the bowel                            preparation was excellent. The ileocecal valve,                            appendiceal orifice, and rectum were photographed.                            The bowel preparation used was Miralax via split                            dose instruction. Scope In: 8:52:28 AM Scope Out: 9:08:29 AM Scope Withdrawal Time: 0 hours 8 minutes 39 seconds  Total Procedure Duration: 0 hours 16 minutes 1 second  Findings:                 The perianal and digital rectal examinations were                            normal.  Multiple diverticula were found in the sigmoid                            colon and transverse colon.                           The exam was otherwise without abnormality on                            direct and retroflexion views. Complications:            No immediate complications. Estimated Blood Loss:     Estimated blood loss: none. Impression:               - Diverticulosis in the sigmoid colon and in the                            transverse colon.                           - The examination was otherwise normal on direct                            and retroflexion views.                           - No specimens collected. Recommendation:           - Patient has a contact number available for                            emergencies. The signs and symptoms of potential                            delayed complications were discussed with the                            patient.  Return to normal activities tomorrow.                            Written discharge instructions were provided to the                            patient.                           - Resume previous diet.                           - Continue present medications.                           - Repeat colonoscopy in 10 years for screening                            purposes. Gatha Mayer, MD 10/20/2022 9:12:23 AM This report has been signed electronically.

## 2022-10-20 NOTE — Patient Instructions (Addendum)
No polyps or cancer were sen - excellent prep.  You do have diverticulosis - thickened muscle rings and pouches in the colon wall. Please read the handout about this condition.  Next routine colonoscopy or other screening test in 10 years - 2033.  I appreciate the opportunity to care for you. Gatha Mayer, MD, FACG  YOU HAD AN ENDOSCOPIC PROCEDURE TODAY AT [facility]:   Refer to the procedure report that was given to you for any specific questions about what was found during the examination.  If the procedure report does not answer your questions, please call your gastroenterologist to clarify.  If you requested that your care partner not be given the details of your procedure findings, then the procedure report has been included in a sealed envelope for you to review at your convenience later.  YOU SHOULD EXPECT: Some feelings of bloating in the abdomen. Passage of more gas than usual.  Walking can help get rid of the air that was put into your GI tract during the procedure and reduce the bloating. If you had a lower endoscopy (such as a colonoscopy or flexible sigmoidoscopy) you may notice spotting of blood in your stool or on the toilet paper. If you underwent a bowel prep for your procedure, you may not have a normal bowel movement for a few days.  Please Note:  You might notice some irritation and congestion in your nose or some drainage.  This is from the oxygen used during your procedure.  There is no need for concern and it should clear up in a day or so.  SYMPTOMS TO REPORT IMMEDIATELY:  Following lower endoscopy (colonoscopy or flexible sigmoidoscopy):  Excessive amounts of blood in the stool  Significant tenderness or worsening of abdominal pains  Swelling of the abdomen that is new, acute  Fever of 100F or higher  For urgent or emergent issues, a gastroenterologist can be reached at any hour by calling [phone]. Do not use MyChart messaging for urgent  concerns.    DIET:  We do recommend a small meal at first, but then you may proceed to your regular diet.  Drink plenty of fluids but you should avoid alcoholic beverages for 24 hours.  ACTIVITY:  You should plan to take it easy for the rest of today and you should NOT DRIVE or use heavy machinery until tomorrow (because of the sedation medicines used during the test).    FOLLOW UP: Our staff will call the number listed on your records the next business day following your procedure.  We will call around 7:15- 8:00 am to check on you and address any questions or concerns that you may have regarding the information given to you following your procedure. If we do not reach you, we will leave a message.     If any biopsies were taken you will be contacted by phone or by letter within the next 1-3 weeks.  Please call us at [phone] if you have not heard about the biopsies in 3 weeks.    SIGNATURES/CONFIDENTIALITY: You and/or your care partner have signed paperwork which will be entered into your electronic medical record.  These signatures attest to the fact that that the information above on your After Visit Summary has been reviewed and is understood.  Full responsibility of the confidentiality of this discharge information lies with you and/or your care-partner.

## 2022-10-20 NOTE — Progress Notes (Signed)
Pt's states no medical or surgical changes since previsit or office visit. 

## 2022-10-21 ENCOUNTER — Telehealth: Payer: Self-pay | Admitting: *Deleted

## 2022-10-21 NOTE — Telephone Encounter (Signed)
  Follow up Call-     10/20/2022    7:43 AM  Call back number  Post procedure Call Back phone  # [phone]  Permission to leave phone message Yes    Post procedure follow up phone call. No answer at number given.  Left message on voicemail.

## 2022-10-27 ENCOUNTER — Other Ambulatory Visit (HOSPITAL_COMMUNITY): Payer: Self-pay

## 2022-11-22 DIAGNOSIS — L821 Other seborrheic keratosis: Secondary | ICD-10-CM | POA: Diagnosis not present

## 2022-11-22 DIAGNOSIS — D2272 Melanocytic nevi of left lower limb, including hip: Secondary | ICD-10-CM | POA: Diagnosis not present

## 2022-11-22 DIAGNOSIS — D225 Melanocytic nevi of trunk: Secondary | ICD-10-CM | POA: Diagnosis not present

## 2022-11-22 DIAGNOSIS — D2262 Melanocytic nevi of left upper limb, including shoulder: Secondary | ICD-10-CM | POA: Diagnosis not present

## 2022-11-22 DIAGNOSIS — D2261 Melanocytic nevi of right upper limb, including shoulder: Secondary | ICD-10-CM | POA: Diagnosis not present

## 2022-11-22 DIAGNOSIS — L570 Actinic keratosis: Secondary | ICD-10-CM | POA: Diagnosis not present

## 2022-11-22 DIAGNOSIS — D2271 Melanocytic nevi of right lower limb, including hip: Secondary | ICD-10-CM | POA: Diagnosis not present

## 2022-11-22 DIAGNOSIS — L918 Other hypertrophic disorders of the skin: Secondary | ICD-10-CM | POA: Diagnosis not present

## 2022-11-22 DIAGNOSIS — D1801 Hemangioma of skin and subcutaneous tissue: Secondary | ICD-10-CM | POA: Diagnosis not present

## 2022-12-03 ENCOUNTER — Other Ambulatory Visit: Payer: Self-pay

## 2023-04-05 ENCOUNTER — Other Ambulatory Visit (HOSPITAL_COMMUNITY): Payer: Self-pay

## 2023-06-20 DIAGNOSIS — Z1231 Encounter for screening mammogram for malignant neoplasm of breast: Secondary | ICD-10-CM | POA: Diagnosis not present

## 2023-06-20 DIAGNOSIS — Z01419 Encounter for gynecological examination (general) (routine) without abnormal findings: Secondary | ICD-10-CM | POA: Diagnosis not present

## 2023-06-20 DIAGNOSIS — Z6838 Body mass index (BMI) 38.0-38.9, adult: Secondary | ICD-10-CM | POA: Diagnosis not present

## 2023-09-29 ENCOUNTER — Other Ambulatory Visit (HOSPITAL_COMMUNITY): Payer: Self-pay

## 2023-09-29 MED ORDER — ALPRAZOLAM 0.5 MG PO TABS
0.5000 mg | ORAL_TABLET | Freq: Every day | ORAL | 3 refills | Status: AC | PRN
  Filled 2023-09-29: qty 30, 30d supply, fill #0
  Filled 2024-02-14: qty 30, 30d supply, fill #1

## 2023-09-29 MED ORDER — SERTRALINE HCL 50 MG PO TABS
50.0000 mg | ORAL_TABLET | Freq: Every day | ORAL | 3 refills | Status: AC
  Filled 2023-09-29: qty 90, 90d supply, fill #0
  Filled 2024-02-14: qty 90, 90d supply, fill #1

## 2023-09-30 ENCOUNTER — Other Ambulatory Visit (HOSPITAL_COMMUNITY): Payer: Self-pay

## 2023-10-03 ENCOUNTER — Other Ambulatory Visit (HOSPITAL_COMMUNITY): Payer: Self-pay

## 2024-01-12 DIAGNOSIS — D1801 Hemangioma of skin and subcutaneous tissue: Secondary | ICD-10-CM | POA: Diagnosis not present

## 2024-01-12 DIAGNOSIS — L814 Other melanin hyperpigmentation: Secondary | ICD-10-CM | POA: Diagnosis not present

## 2024-01-12 DIAGNOSIS — L821 Other seborrheic keratosis: Secondary | ICD-10-CM | POA: Diagnosis not present

## 2024-01-12 DIAGNOSIS — D224 Melanocytic nevi of scalp and neck: Secondary | ICD-10-CM | POA: Diagnosis not present

## 2024-01-12 DIAGNOSIS — D2271 Melanocytic nevi of right lower limb, including hip: Secondary | ICD-10-CM | POA: Diagnosis not present

## 2024-01-12 DIAGNOSIS — L918 Other hypertrophic disorders of the skin: Secondary | ICD-10-CM | POA: Diagnosis not present

## 2024-01-12 DIAGNOSIS — D225 Melanocytic nevi of trunk: Secondary | ICD-10-CM | POA: Diagnosis not present

## 2024-01-12 DIAGNOSIS — L570 Actinic keratosis: Secondary | ICD-10-CM | POA: Diagnosis not present

## 2024-02-03 DIAGNOSIS — H5213 Myopia, bilateral: Secondary | ICD-10-CM | POA: Diagnosis not present

## 2024-02-14 ENCOUNTER — Other Ambulatory Visit: Payer: Self-pay

## 2024-07-16 ENCOUNTER — Other Ambulatory Visit (HOSPITAL_COMMUNITY): Payer: Self-pay

## 2024-07-16 DIAGNOSIS — Z01419 Encounter for gynecological examination (general) (routine) without abnormal findings: Secondary | ICD-10-CM | POA: Diagnosis not present

## 2024-07-16 DIAGNOSIS — Z6834 Body mass index (BMI) 34.0-34.9, adult: Secondary | ICD-10-CM | POA: Diagnosis not present

## 2024-07-16 DIAGNOSIS — Z124 Encounter for screening for malignant neoplasm of cervix: Secondary | ICD-10-CM | POA: Diagnosis not present

## 2024-07-16 DIAGNOSIS — Z1231 Encounter for screening mammogram for malignant neoplasm of breast: Secondary | ICD-10-CM | POA: Diagnosis not present

## 2024-07-16 MED ORDER — ALPRAZOLAM 0.5 MG PO TABS
0.5000 mg | ORAL_TABLET | ORAL | 3 refills | Status: AC | PRN
  Filled 2024-07-16: qty 30, 30d supply, fill #0
  Filled 2024-09-06: qty 30, 30d supply, fill #1
  Filled 2024-10-12: qty 30, 30d supply, fill #2

## 2024-07-16 MED ORDER — SERTRALINE HCL 50 MG PO TABS
50.0000 mg | ORAL_TABLET | Freq: Every day | ORAL | 3 refills | Status: AC
  Filled 2024-07-16: qty 60, 60d supply, fill #0
  Filled 2024-07-16: qty 30, 30d supply, fill #0

## 2024-08-23 ENCOUNTER — Other Ambulatory Visit (HOSPITAL_COMMUNITY): Payer: Self-pay

## 2024-09-07 ENCOUNTER — Other Ambulatory Visit: Payer: Self-pay

## 2024-10-12 ENCOUNTER — Other Ambulatory Visit: Payer: Self-pay
